# Patient Record
Sex: Female | Born: 1990 | Race: Black or African American | Hispanic: No | Marital: Married | Smoking: Never smoker
Health system: Southern US, Community
[De-identification: ages and names within clinical notes are randomized; demographics above are authoritative.]

## PROBLEM LIST (undated history)

## (undated) ENCOUNTER — Inpatient Hospital Stay (HOSPITAL_COMMUNITY): Payer: BC Managed Care – PPO

## (undated) DIAGNOSIS — E785 Hyperlipidemia, unspecified: Secondary | ICD-10-CM

## (undated) DIAGNOSIS — N912 Amenorrhea, unspecified: Secondary | ICD-10-CM

## (undated) DIAGNOSIS — O24419 Gestational diabetes mellitus in pregnancy, unspecified control: Secondary | ICD-10-CM

## (undated) DIAGNOSIS — D219 Benign neoplasm of connective and other soft tissue, unspecified: Secondary | ICD-10-CM

## (undated) DIAGNOSIS — R011 Cardiac murmur, unspecified: Secondary | ICD-10-CM

## (undated) DIAGNOSIS — D649 Anemia, unspecified: Secondary | ICD-10-CM

## (undated) HISTORY — DX: Amenorrhea, unspecified: N91.2

## (undated) HISTORY — DX: Cardiac murmur, unspecified: R01.1

## (undated) HISTORY — PX: NO PAST SURGERIES: SHX2092

## (undated) HISTORY — DX: Hyperlipidemia, unspecified: E78.5

## (undated) HISTORY — DX: Anemia, unspecified: D64.9

---

## 2012-06-23 ENCOUNTER — Ambulatory Visit
Admission: RE | Admit: 2012-06-23 | Discharge: 2012-06-23 | Disposition: A | Discharge status: Home / Self Care | Payer: BC Managed Care – PPO | Source: Ambulatory Visit | Attending: Family Medicine | Admitting: Family Medicine

## 2012-06-23 ENCOUNTER — Other Ambulatory Visit: Payer: Self-pay | Admitting: Family Medicine

## 2012-06-23 DIAGNOSIS — R102 Pelvic and perineal pain: Secondary | ICD-10-CM

## 2014-11-18 DIAGNOSIS — D649 Anemia, unspecified: Secondary | ICD-10-CM

## 2014-11-18 HISTORY — DX: Anemia, unspecified: D64.9

## 2015-11-19 DIAGNOSIS — E785 Hyperlipidemia, unspecified: Secondary | ICD-10-CM

## 2015-11-19 HISTORY — DX: Hyperlipidemia, unspecified: E78.5

## 2016-01-29 ENCOUNTER — Ambulatory Visit (INDEPENDENT_AMBULATORY_CARE_PROVIDER_SITE_OTHER): Payer: BLUE CROSS/BLUE SHIELD | Admitting: Obstetrics and Gynecology

## 2016-01-29 ENCOUNTER — Encounter: Payer: Self-pay | Admitting: Obstetrics and Gynecology

## 2016-01-29 VITALS — BP 122/78 | HR 86 | Resp 14 | Ht 66.0 in | Wt 262.8 lb

## 2016-01-29 DIAGNOSIS — N912 Amenorrhea, unspecified: Secondary | ICD-10-CM | POA: Diagnosis not present

## 2016-01-29 DIAGNOSIS — E669 Obesity, unspecified: Secondary | ICD-10-CM

## 2016-01-29 LAB — POCT URINE PREGNANCY: Preg Test, Ur: NEGATIVE

## 2016-01-29 MED ORDER — MEDROXYPROGESTERONE ACETATE 10 MG PO TABS: 10.0000 mg | ORAL_TABLET | Freq: Every day | ORAL | Status: DC

## 2016-01-29 NOTE — Patient Instructions (Signed)
Polycystic Ovarian Syndrome  Polycystic ovarian syndrome (PCOS) is a common hormonal disorder among women of reproductive age. Most women with PCOS grow many small cysts on their ovaries. PCOS can cause problems with your periods and make it difficult to get pregnant. It can also cause an increased risk of miscarriage with pregnancy. If left untreated, PCOS can lead to serious health problems, such as diabetes and heart disease.  CAUSES  The cause of PCOS is not fully understood, but genetics may be a factor.  SIGNS AND SYMPTOMS   · Infrequent or no menstrual periods.    · Inability to get pregnant (infertility) because of not ovulating.    · Increased growth of hair on the face, chest, stomach, back, thumbs, thighs, or toes.    · Acne, oily skin, or dandruff.    · Pelvic pain.    · Weight gain or obesity, usually carrying extra weight around the waist.    · Type 2 diabetes.     · High cholesterol.    · High blood pressure.    · Female-pattern baldness or thinning hair.    · Patches of thickened and dark brown or black skin on the neck, arms, breasts, or thighs.    · Tiny excess flaps of skin (skin tags) in the armpits or neck area.    · Excessive snoring and having breathing stop at times while asleep (sleep apnea).    · Deepening of the voice.    · Gestational diabetes when pregnant.    DIAGNOSIS   There is no single test to diagnose PCOS.   · Your health care provider will:      Take a medical history.      Perform a pelvic exam.      Have ultrasonography done.      Check your female and female hormone levels.      Measure glucose or sugar levels in the blood.      Do other blood tests.    · If you are producing too many female hormones, your health care provider will make sure it is from PCOS. At the physical exam, your health care provider will want to evaluate the areas of increased hair growth. Try to allow natural hair growth for a few days before the visit.    · During a pelvic exam, the ovaries may be enlarged  or swollen because of the increased number of small cysts. This can be seen more easily by using vaginal ultrasonography or screening to examine the ovaries and lining of the uterus (endometrium) for cysts. The uterine lining may become thicker if you have not been having a regular period.    TREATMENT   Because there is no cure for PCOS, it needs to be managed to prevent problems. Treatments are based on your symptoms. Treatment is also based on whether you want to have a baby or whether you need contraception.   Treatment may include:   · Progesterone hormone to start a menstrual period.    · Birth control pills to make you have regular menstrual periods.    · Medicines to make you ovulate, if you want to get pregnant.    · Medicines to control your insulin.    · Medicine to control your blood pressure.    · Medicine and diet to control your high cholesterol and triglycerides in your blood.  · Medicine to reduce excessive hair growth.   · Surgery, making small holes in the ovary, to decrease the amount of female hormone production. This is done through a long, lighted tube (laparoscope) placed into the pelvis through a tiny incision in the lower abdomen.      HOME CARE INSTRUCTIONS  · Only take over-the-counter or prescription medicine as directed by your health care provider.  · Pay attention to the foods you eat and your activity levels. This can help reduce the effects of PCOS.    Keep your weight under control.    Eat foods that are low in carbohydrate and high in fiber.    Exercise regularly.  SEEK MEDICAL CARE IF:  · Your symptoms do not get better with medicine.  · You have new symptoms.     This information is not intended to replace advice given to you by your health care provider. Make sure you discuss any questions you have with your health care provider.     Document Released: 02/28/2005 Document Revised: 08/25/2013 Document Reviewed: 04/22/2013  Elsevier Interactive Patient Education ©2016 Elsevier  Inc.

## 2016-01-29 NOTE — Progress Notes (Signed)
Patient ID: Rhonda Turner, female   DOB: 1991-05-01, 25 y.o.   MRN: [id number] GYNECOLOGY  VISIT   HPI: 25 y.o.   Single  African American  female   [place] with Patient's last menstrual period was 12/03/2015 (exact date).   here for evaluation of amenorrhea.  Patient states normal cycles until 09/2015.  Her cycles then became 40-50 days apart.  She does bleeding 7-14 days when she does have a cycle and has severe cramping.    Menarche in 7th grade.  Cycles have been regular until November 2016.  Cycle was late by about a week.  Same pattern of one week late in December and January.  Then no cycle since 12/03/15. Bleeding pattern is to use pad with tampon - change every 3 hours and some clotting.  Some cramping.  Ibuprofen decreases the cramping, but they do not go completely away.  Able to work sometimes with her cycles.  Can take off two days due to the pain.   Tried birth control in high school and had nausea and vomiting.  Ibuprofen did not work well.  Feels like she is cold always.  No constipation.  No hair loss.  Gained 40 pounds from June 2016 - until now.  Quit her job as this was very stressful and when she gained the weight.  Some headaches.  No loss of vision.  No nipple discharge.  No hair growth or loss.   Did UPTs at home which are negative.  Hx of ovarian cysts since high school. Been to the ER for pain due to ruptured ovarian cysts.  Last  U/S 2013.   Just graduated from [employer]. 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  UPT - Negative.   PCP - none.   GYNECOLOGIC HISTORY: Patient's last menstrual period was 12/03/2015 (exact date). Contraception:condoms everytime Menopausal hormone therapy: n/a Last mammogram: n/a Last pap smear: Never per patient        OB History    Gravida Para Term Preterm AB TAB SAB Ectopic Multiple Living   0 0 0 0 0 0 0 0 0 0          There are no active problems to display for this  patient.   Past Medical History  Diagnosis Date  . Heart murmur     --as a child-- no current workup  . Amenorrhea     began skipping cycles 09/2015  . Anemia 2016    History reviewed. No pertinent past surgical history.  No current outpatient prescriptions on file.   No current facility-administered medications for this visit.     ALLERGIES: Review of patient's allergies indicates no known allergies.  Family History  Problem Relation Age of Onset  . Diabetes Mother   . Hypertension Mother   . Stroke Maternal Grandmother   . Hypertension Maternal Grandfather     Social History   Social History  . Marital Status: Single    Spouse Name: N/A  . Number of Children: N/A  . Years of Education: N/A   Occupational History  . Not on file.   Social History Main Topics  . Smoking status: Never Smoker   . Smokeless tobacco: Not on file  . Alcohol Use: 1.2 oz/week    2 Standard drinks or equivalent per week  . Drug Use: No  . Sexual Activity:    Partners: Male    Birth Control/ Protection: Condom     Comment: condoms  everytime   Other Topics Concern  . Not on file   Social History Narrative  . No narrative on file    ROS:  Pertinent items are noted in HPI.  PHYSICAL EXAMINATION:    BP 122/78 mmHg  Pulse 86  Resp 14  Ht 5\' 6"  (1.676 m)  Wt 262 lb 12.8 oz (119.205 kg)  BMI 42.44 kg/m2  LMP 12/03/2015 (Exact Date)    General appearance: alert, cooperative and appears stated age Head: Normocephalic, without obvious abnormality, atraumatic Neck: no adenopathy, supple, symmetrical, trachea midline and thyroid normal to inspection and palpation Lungs: clear to auscultation bilaterally Breasts: normal appearance, no masses or tenderness, Inspection negative, No nipple retraction or dimpling, No nipple discharge or bleeding, No axillary or supraclavicular adenopathy Heart: regular rate and rhythm Abdomen: soft, non-tender; bowel sounds normal; no masses,  no  organomegaly Extremities: extremities normal, atraumatic, no cyanosis or edema Skin: Skin color, texture, turgor normal. No rashes or lesions Lymph nodes: Cervical, supraclavicular, and axillary nodes normal. No abnormal inguinal nodes palpated Neurologic: Grossly normal  Pelvic: External genitalia:  no lesions              Urethra:  normal appearing urethra with no masses, tenderness or lesions              Bartholins and Skenes: normal                 Vagina: normal appearing vagina with normal color and discharge, no lesions              Cervix: no lesions            Bimanual Exam:  Uterus:  normal size, contour, position, consistency, mobility, non-tender              Adnexa: normal adnexa and no mass, fullness, tenderness              Bimanual exam is limited by body habitus.             Chaperone was present for exam.  ASSESSMENT  Oligomenorrhea. UPT negative.  Menorrhagia.  Hx anemia.  Dysmenorrhea. Obesity. Hx of ruptured ovarian cysts.  PLAN  Counseled regarding irregular cycles and suspected Polycystic Ovarian Disease. Labs - TSH, prolactin, LH, FSH, estradiol, CMP, CBC, cholesterol, fasting insulin.  Provera 10 mg po q d for 10 days.  Instructed in use.  Return for pelvic ultrasound. Final plan for cycle regulation and treatment of dysmenorrhea to follow ultrasound and blood work. Discussed the importance of weight loss.   An After Visit Summary was printed and given to the patient.  ___45___ minutes face to face time of which over 50% was spent in counseling.

## 2016-01-30 ENCOUNTER — Telehealth: Payer: Self-pay | Admitting: Obstetrics and Gynecology

## 2016-01-30 NOTE — Telephone Encounter (Signed)
Called patient to review benefits for a recommended procedure. Left Voicemail requesting a call back. °

## 2016-01-31 NOTE — Telephone Encounter (Signed)
Patient returned my call. Reviewed benefit for ultrasound. Patient understood and agreeable. Patient ready to schedule. Patient scheduled 02/01/16 with Dr Quincy Simmonds. Pt aware of arrival date and time. Pt aware of 72 hours cancellation policy with [id number] fee. No further questions. Ok to close

## 2016-02-01 ENCOUNTER — Ambulatory Visit (INDEPENDENT_AMBULATORY_CARE_PROVIDER_SITE_OTHER): Payer: BLUE CROSS/BLUE SHIELD | Admitting: Obstetrics and Gynecology

## 2016-02-01 ENCOUNTER — Ambulatory Visit (INDEPENDENT_AMBULATORY_CARE_PROVIDER_SITE_OTHER): Payer: BLUE CROSS/BLUE SHIELD

## 2016-02-01 ENCOUNTER — Other Ambulatory Visit: Payer: Self-pay | Admitting: Obstetrics and Gynecology

## 2016-02-01 ENCOUNTER — Encounter: Payer: Self-pay | Admitting: Obstetrics and Gynecology

## 2016-02-01 VITALS — BP 110/70 | HR 80 | Ht 66.0 in | Wt 260.0 lb

## 2016-02-01 DIAGNOSIS — N912 Amenorrhea, unspecified: Secondary | ICD-10-CM | POA: Diagnosis not present

## 2016-02-01 DIAGNOSIS — N926 Irregular menstruation, unspecified: Secondary | ICD-10-CM

## 2016-02-01 LAB — CBC
HEMATOCRIT: 38.3 % (ref 36.0–46.0)
HEMOGLOBIN: 12.6 g/dL (ref 12.0–15.0)
MCH: 26.5 pg (ref 26.0–34.0)
MCHC: 32.9 g/dL (ref 30.0–36.0)
MCV: 80.6 fL (ref 78.0–100.0)
MPV: 10.3 fL (ref 8.6–12.4)
Platelets: 199 10*3/uL (ref 150–400)
RBC: 4.75 MIL/uL (ref 3.87–5.11)
RDW: 16.2 % — AB (ref 11.5–15.5)
WBC: 4.3 10*3/uL (ref 4.0–10.5)

## 2016-02-01 MED ORDER — NORETHINDRONE 0.35 MG PO TABS: 1.0000 | ORAL_TABLET | Freq: Every day | ORAL | Status: DC

## 2016-02-01 NOTE — Progress Notes (Signed)
Subjective  25 y.o. G0P0000 Single African American female here for pelvic ultrasound for evaluation of oligomenorrhea and hx of ovarian cysts.   Patient's last menstrual period was 12/03/2015 (exact date).   Patient has hx of recent rapid weight gain, and then irregularity of menses. Menses last 7 - 14 days when occur and can be very heavy. Doing lab work today to check for PCOS and other etiologies of irregular cycles.  Had nausea with use of combined. OCPs in the past.   Doing course of Provera now.  STD testing last May 2017.  All was negative.   Objective  Pelvic ultrasound images and report reviewed with patient.  Uterus - 15 mm anterior subserosal fibroid. EMS - 9.85 mm. Ovaries - right ovary with enlarged volume and peripheral follicles consistent with PCOS pattern.  Left ovary normal volume. Free fluid - no      Assessment  Irregular menses.  Menorrhagia. I suspect PCOS etiology. Nausea w/ combined OCPs  Plan  Discussion of PCOS.  Proceed with lab work as ordered - fasting - chol, TSH, FSH, LH, prolactin, estradiol, fasting insulin, CMP, CBC. Discussion of options for cycles regulation. Micronor, Depo Provera, Nexplanon, progesterone IUDs. Patient chooses Micronor.  Instructed in use.  Follow up in 2 months for a recheck and annual exam and pap.  ___15____ minutes face to face time of which over 50% was spent in counseling.   After visit summary to patient.

## 2016-02-02 LAB — COMPREHENSIVE METABOLIC PANEL
ALBUMIN: 4.5 g/dL (ref 3.6–5.1)
ALT: 12 U/L (ref 6–29)
AST: 14 U/L (ref 10–30)
Alkaline Phosphatase: 65 U/L (ref 33–115)
BILIRUBIN TOTAL: 0.4 mg/dL (ref 0.2–1.2)
BUN: 9 mg/dL (ref 7–25)
CALCIUM: 9.3 mg/dL (ref 8.6–10.2)
CHLORIDE: 102 mmol/L (ref 98–110)
CO2: 26 mmol/L (ref 20–31)
Creat: 0.85 mg/dL (ref 0.50–1.10)
GLUCOSE: 94 mg/dL (ref 65–99)
POTASSIUM: 3.8 mmol/L (ref 3.5–5.3)
Sodium: 136 mmol/L (ref 135–146)
Total Protein: 7.5 g/dL (ref 6.1–8.1)

## 2016-02-02 LAB — LIPID PANEL
CHOL/HDL RATIO: 4.8 ratio (ref <=5.0)
CHOLESTEROL: 267 mg/dL — AB (ref 125–200)
HDL: 56 mg/dL (ref >=46)
LDL Cholesterol: 193 mg/dL — ABNORMAL HIGH (ref <130)
TRIGLYCERIDES: 89 mg/dL (ref <150)
VLDL: 18 mg/dL (ref <30)

## 2016-02-02 LAB — FSH/LH
FSH: 7.9 m[IU]/mL
LH: 20.6 m[IU]/mL

## 2016-02-02 LAB — INSULIN, FASTING: Insulin fasting, serum: 14.4 u[IU]/mL (ref 2.0–19.6)

## 2016-02-02 LAB — ESTRADIOL: Estradiol: 98 pg/mL

## 2016-02-02 LAB — PROLACTIN: Prolactin: 6.1 ng/mL

## 2016-02-02 LAB — TSH: TSH: 1.25 mIU/L

## 2016-02-07 LAB — HEMOGLOBIN A1C
HEMOGLOBIN A1C: 5.6 % (ref <5.7)
MEAN PLASMA GLUCOSE: 114 mg/dL (ref <117)

## 2016-02-09 ENCOUNTER — Encounter: Payer: Self-pay | Admitting: Obstetrics and Gynecology

## 2016-02-12 ENCOUNTER — Telehealth: Payer: Self-pay | Admitting: Emergency Medicine

## 2016-02-12 NOTE — Telephone Encounter (Signed)
-----   Message from Nunzio Cobbs, MD sent at 02/09/2016  2:41 PM EDT ----- I just sent through a message about the patient's full labs.  The hemoglobin A1C was reported separately and does not show prediabetes or diabetes.   Cc- Marisa Sprinkles

## 2016-02-12 NOTE — Telephone Encounter (Signed)
Message left to return call to [place] at [phone].   Patient is scheduled for annual exam with Dr. Quincy Simmonds 04/24/16 at 0800.

## 2016-02-12 NOTE — Telephone Encounter (Signed)
Patient returned call and she is given results of lab work from Dr. Quincy Simmonds.  Verbalized understanding and will followup as scheduled for annual exam in June with Dr. Quincy Simmonds.

## 2016-02-12 NOTE — Telephone Encounter (Signed)
-----   Message from Nunzio Cobbs, MD sent at 02/09/2016  2:39 PM EDT ----- Please inform patient of her labs.  LH and FSH levels support a diagnosis of PCOS as we discussed.  Her cholesterol is elevated and can be reduced through a low cholesterol diet, a vigorous exercise program, and slow sustained weight loss.  Glucose and insulin levels are normal.  Thyroid, prolactin, blood chemistries and blood counts are normal.   I will see her in June for her annual exam and recheck of use of the Micronor.  Cc- Marisa Sprinkles

## 2016-04-24 ENCOUNTER — Ambulatory Visit: Payer: BLUE CROSS/BLUE SHIELD | Admitting: Obstetrics and Gynecology

## 2016-05-16 ENCOUNTER — Ambulatory Visit: Payer: BLUE CROSS/BLUE SHIELD | Admitting: Obstetrics and Gynecology

## 2016-05-16 ENCOUNTER — Encounter: Payer: Self-pay | Admitting: Obstetrics and Gynecology

## 2016-09-24 ENCOUNTER — Encounter: Payer: Self-pay | Admitting: Obstetrics and Gynecology

## 2019-11-19 NOTE — L&D Delivery Note (Signed)
Cesarean Section Procedure Note  Indications: cephalo-pelvic disproportion, fibroids, non-reassuring fetal status and LGA  Pre-operative Diagnosis: 38 week 3 day pregnancy.  Post-operative Diagnosis: same  Surgeon: Isaiah Serge DO  Assistants: Eula Flax MD  Melissa D.Y Scrub  Anesthesia: Spinal anesthesia  ASA Class: 3   Procedure Details    The patient was seen in the Holding Room. The risks, benefits, complications, treatment options, and expected outcomes were discussed with the patient.  The patient concurred with the proposed plan, giving informed consent.   The patient was taken to Operating Room, identified as Rhonda Turner and the procedure verified as C-Section Delivery. A Time Out was held and the above information confirmed.  After induction of anesthesia, a traxi was placed.  The patient begun to complain of shortness of breath and anxiety. Position changes were safely effected for pt reassurance as vitals were stable. At this time, fetal heart tones were noted to be decreased in the 80s thus stat cesarean section was called.   The pt was was draped and prepped with chlorahexidine and the drape placed. A quick time out was reverified. . A Pfannenstiel incision was made and carried down through the subcutaneous tissue to the fascia. Fascial incision was made and extended transversely. The fascia was separated from the underlying rectus tissue superiorly and inferiorly. The peritoneum was identified and entered. Peritoneal incision was extended longitudinally. An alexis retractor was placed. A low transverse uterine incision was made. Delivered from cephalic presentation was a 9lbs 7oz gram Female with Apgar scores of 3 at one minute and 7 at five minutes and 8 at 46mins. After the umbilical cord was clamped and cut cord gas and blood were obtained for evaluation. The placenta was removed intact and appeared normal. The uterine was noted to have a lage palm sized (  6cm) anterior fibroid in the lower left uterine quadrant, just above the incision and a large fundal right fibroid alone with a few smaller subserosal ones. The tubes and ovaries appeared normal. The uterine incision was closed with running locked sutures of O Vicryl. Hemostasis was observed. Lavage was carried out until clear. The peritoneum was closed with 3-0 vicryl. The fascia was then reapproximated with running sutures of O Vicryl. The skin was reapproximated with 4-0 vicryl on a keith .  Instrument, sponge, and needle counts were correct prior the abdominal closure and at the conclusion of the case.   Findings: Viable female infant, Apgars 3,7,8; cord gas 7.02,  Multilobed ibroid uterus with normal appearing tubes and ovaries  Estimated Blood Loss:  831ml         Drains: foley to gavity 138ml         Total IV Fluids:  3073ml         Specimens: Placenta and Disposition:  Sent to Pathology          Implants: n/a         Complications:  None; patient tolerated the procedure well.         Disposition: PACU - hemodynamically stable.         Condition: stable  Attending Attestation: I performed the procedure.

## 2019-12-28 LAB — OB RESULTS CONSOLE ABO/RH: RH Type: POSITIVE

## 2019-12-28 LAB — OB RESULTS CONSOLE ANTIBODY SCREEN: Antibody Screen: NEGATIVE

## 2019-12-28 LAB — OB RESULTS CONSOLE HIV ANTIBODY (ROUTINE TESTING): HIV: NONREACTIVE

## 2019-12-28 LAB — OB RESULTS CONSOLE GC/CHLAMYDIA
Chlamydia: NEGATIVE
Gonorrhea: NEGATIVE

## 2019-12-28 LAB — OB RESULTS CONSOLE HEPATITIS B SURFACE ANTIGEN: Hepatitis B Surface Ag: NEGATIVE

## 2019-12-28 LAB — OB RESULTS CONSOLE RPR: RPR: NONREACTIVE

## 2019-12-28 LAB — OB RESULTS CONSOLE RUBELLA ANTIBODY, IGM: Rubella: IMMUNE

## 2020-01-18 ENCOUNTER — Other Ambulatory Visit: Payer: Self-pay

## 2020-01-18 ENCOUNTER — Encounter (HOSPITAL_COMMUNITY): Payer: Self-pay | Admitting: Family Medicine

## 2020-01-18 ENCOUNTER — Emergency Department (HOSPITAL_COMMUNITY)
Admission: EM | Admit: 2020-01-18 | Discharge: 2020-01-18 | Disposition: A | Discharge status: Home / Self Care | Payer: BC Managed Care – PPO | Attending: Emergency Medicine | Admitting: Emergency Medicine

## 2020-01-18 DIAGNOSIS — O99891 Other specified diseases and conditions complicating pregnancy: Secondary | ICD-10-CM | POA: Insufficient documentation

## 2020-01-18 DIAGNOSIS — R8271 Bacteriuria: Secondary | ICD-10-CM

## 2020-01-18 DIAGNOSIS — O219 Vomiting of pregnancy, unspecified: Secondary | ICD-10-CM | POA: Insufficient documentation

## 2020-01-18 DIAGNOSIS — Z79899 Other long term (current) drug therapy: Secondary | ICD-10-CM | POA: Insufficient documentation

## 2020-01-18 DIAGNOSIS — Z3A22 22 weeks gestation of pregnancy: Secondary | ICD-10-CM | POA: Diagnosis not present

## 2020-01-18 LAB — CBC WITH DIFFERENTIAL/PLATELET
Abs Immature Granulocytes: 0.03 10*3/uL (ref 0.00–0.07)
Basophils Absolute: 0 10*3/uL (ref 0.0–0.1)
Basophils Relative: 1 %
Eosinophils Absolute: 0.1 10*3/uL (ref 0.0–0.5)
Eosinophils Relative: 2 %
HCT: 39.2 % (ref 36.0–46.0)
Hemoglobin: 11.8 g/dL — ABNORMAL LOW (ref 12.0–15.0)
Immature Granulocytes: 1 %
Lymphocytes Relative: 24 %
Lymphs Abs: 1.6 10*3/uL (ref 0.7–4.0)
MCH: 24.6 pg — ABNORMAL LOW (ref 26.0–34.0)
MCHC: 30.1 g/dL (ref 30.0–36.0)
MCV: 81.7 fL (ref 80.0–100.0)
Monocytes Absolute: 0.8 10*3/uL (ref 0.1–1.0)
Monocytes Relative: 12 %
Neutro Abs: 4.1 10*3/uL (ref 1.7–7.7)
Neutrophils Relative %: 60 %
Platelets: 262 10*3/uL (ref 150–400)
RBC: 4.8 MIL/uL (ref 3.87–5.11)
RDW: 18.6 % — ABNORMAL HIGH (ref 11.5–15.5)
WBC: 6.6 10*3/uL (ref 4.0–10.5)
nRBC: 0 % (ref 0.0–0.2)

## 2020-01-18 LAB — URINALYSIS, ROUTINE W REFLEX MICROSCOPIC
Bilirubin Urine: NEGATIVE
Glucose, UA: NEGATIVE mg/dL
Hgb urine dipstick: NEGATIVE
Ketones, ur: NEGATIVE mg/dL
Nitrite: NEGATIVE
Protein, ur: 30 mg/dL — AB
Specific Gravity, Urine: 1.027 (ref 1.005–1.030)
pH: 9 — ABNORMAL HIGH (ref 5.0–8.0)

## 2020-01-18 LAB — COMPREHENSIVE METABOLIC PANEL
ALT: 12 U/L (ref 0–44)
AST: 17 U/L (ref 15–41)
Albumin: 4 g/dL (ref 3.5–5.0)
Alkaline Phosphatase: 49 U/L (ref 38–126)
Anion gap: 10 (ref 5–15)
BUN: 6 mg/dL (ref 6–20)
CO2: 24 mmol/L (ref 22–32)
Calcium: 9.2 mg/dL (ref 8.9–10.3)
Chloride: 102 mmol/L (ref 98–111)
Creatinine, Ser: 0.71 mg/dL (ref 0.44–1.00)
GFR calc Af Amer: >60 mL/min (ref >60)
GFR calc non Af Amer: >60 mL/min (ref >60)
Glucose, Bld: 86 mg/dL (ref 70–99)
Potassium: 3.5 mmol/L (ref 3.5–5.1)
Sodium: 136 mmol/L (ref 135–145)
Total Bilirubin: 0.4 mg/dL (ref 0.3–1.2)
Total Protein: 8.4 g/dL — ABNORMAL HIGH (ref 6.5–8.1)

## 2020-01-18 LAB — PREGNANCY, URINE: Preg Test, Ur: POSITIVE — AB

## 2020-01-18 MED ORDER — PYRIDOXINE HCL 100 MG/ML IJ SOLN
100.0000 mg | Freq: Once | INTRAMUSCULAR | Status: AC
  Administered 2020-01-18: 100 mg via INTRAVENOUS
  Filled 2020-01-18: qty 1

## 2020-01-18 MED ORDER — NITROFURANTOIN MONOHYD MACRO 100 MG PO CAPS: 100.0000 mg | ORAL_CAPSULE | Freq: Two times a day (BID) | ORAL | 0 refills | Status: DC

## 2020-01-18 MED ORDER — PROMETHAZINE HCL 25 MG/ML IJ SOLN
25.0000 mg | Freq: Once | INTRAMUSCULAR | Status: AC
  Administered 2020-01-18: 25 mg via INTRAVENOUS
  Filled 2020-01-18: qty 1

## 2020-01-18 MED ORDER — DEXTROSE 5 % AND 0.45 % NACL IV BOLUS
1000.0000 mL | Freq: Once | INTRAVENOUS | Status: AC
  Administered 2020-01-18: 1000 mL via INTRAVENOUS

## 2020-01-18 NOTE — ED Notes (Signed)
Patient attempted to drink apple juice, was only able to drink one sip, still feels nauseated.

## 2020-01-18 NOTE — ED Triage Notes (Addendum)
Patient reports she is [redacted] weeks pregnant and having vomiting. Denies abd pain.

## 2020-01-18 NOTE — ED Provider Notes (Signed)
[facility] DEPT Provider Note   CSN: [id number] Arrival date & time: 01/18/20  1638     History Chief Complaint  Patient presents with  . Emesis    [redacted] weeks pregnant     Rhonda Turner is a 29 y.o. female.  HPI     29 year old comes in a chief complaint of vomiting.  She is G1, P0 about [redacted] weeks pregnant.  Patient reports that for the last 2 days she has been having worsening of her nausea and vomiting.  She had emesis x3 yesterday and the same today.  She has constant nausea and anytime she eats or drink, she will just vomit.  She spoke with her OB who has prescribed medications for nausea for her, but advised her to come to the ER.  Review of system is positive for dizziness.  Patient denies any UTI-like symptoms, vaginal discharge, bleeding, abdominal pain, back pain.  Past Medical History:  Diagnosis Date  . Amenorrhea    began skipping cycles 09/2015  . Anemia 2016  . Heart murmur    --as a child-- no current workup  . Hyperlipidemia 2017    There are no problems to display for this patient.   History reviewed. No pertinent surgical history.   OB History    Gravida  1   Para  0   Term  0   Preterm  0   AB  0   Living  0     SAB  0   TAB  0   Ectopic  0   Multiple  0   Live Births              Family History  Problem Relation Age of Onset  . Diabetes Mother   . Hypertension Mother   . Other Mother        hx abnormal pap age 27--no Hyst  . Stroke Maternal Grandmother   . Hypertension Maternal Grandfather     Social History   Tobacco Use  . Smoking status: Never Smoker  . Smokeless tobacco: Never Used  Substance Use Topics  . Alcohol use: Not Currently    Alcohol/week: 2.0 standard drinks    Types: 2 Standard drinks or equivalent per week  . Drug use: No    Home Medications Prior to Admission medications   Medication Sig Start Date End Date Taking? Authorizing Provider  Prenatal Vit-Fe  Fumarate-FA (PRENATAL MULTIVITAMIN) TABS tablet Take 1 tablet by mouth daily at 12 noon.   Yes [provider]  medroxyPROGESTERone (PROVERA) 10 MG tablet Take 1 tablet (10 mg total) by mouth daily. Patient not taking: Reported on 01/18/2020 01/29/16   Nunzio Cobbs, MD  metoCLOPramide (REGLAN) 10 MG tablet Take 10 mg by mouth 3 (three) times daily as needed for nausea. 01/18/20   [provider]  nitrofurantoin, macrocrystal-monohydrate, (MACROBID) 100 MG capsule Take 1 capsule (100 mg total) by mouth 2 (two) times daily. 01/18/20   Varney Biles, MD  norethindrone (MICRONOR,CAMILA,ERRIN) 0.35 MG tablet Take 1 tablet (0.35 mg total) by mouth daily. Patient not taking: Reported on 01/18/2020 02/01/16   Nunzio Cobbs, MD    Allergies    Shellfish allergy  Review of Systems   Review of Systems  Constitutional: Positive for activity change.  Gastrointestinal: Positive for nausea and vomiting. Negative for abdominal pain.  Genitourinary: Negative for dysuria, vaginal bleeding and vaginal discharge.  Allergic/Immunologic: Negative for immunocompromised state.  All other  systems reviewed and are negative.   Physical Exam Updated Vital Signs BP 120/66   Pulse 71   Temp 97.9 F (36.6 C) (Oral)   Resp 18   Ht 5\' 5"  (1.651 m)   Wt 122.5 kg   SpO2 100%   BMI 44.93 kg/m   Physical Exam Vitals and nursing note reviewed.  Constitutional:      Appearance: She is well-developed.  HENT:     Head: Normocephalic and atraumatic.     Mouth/Throat:     Comments: Dry mucous membranes Eyes:     Extraocular Movements: Extraocular movements intact.     Pupils: Pupils are equal, round, and reactive to light.  Cardiovascular:     Rate and Rhythm: Normal rate.  Pulmonary:     Effort: Pulmonary effort is normal.  Abdominal:     General: Bowel sounds are normal.     Tenderness: There is no abdominal tenderness.  Musculoskeletal:     Cervical back: Normal range  of motion and neck supple.  Skin:    General: Skin is warm and dry.  Neurological:     Mental Status: She is alert and oriented to person, place, and time.     ED Results / Procedures / Treatments   Labs (all labs ordered are listed, but only abnormal results are displayed) Labs Reviewed  URINALYSIS, ROUTINE W REFLEX MICROSCOPIC - Abnormal; Notable for the following components:      Result Value   APPearance HAZY (*)    pH 9.0 (*)    Protein, ur 30 (*)    Leukocytes,Ua SMALL (*)    Bacteria, UA MANY (*)    All other components within normal limits  CBC WITH DIFFERENTIAL/PLATELET - Abnormal; Notable for the following components:   Hemoglobin 11.8 (*)    MCH 24.6 (*)    RDW 18.6 (*)    All other components within normal limits  COMPREHENSIVE METABOLIC PANEL - Abnormal; Notable for the following components:   Total Protein 8.4 (*)    All other components within normal limits  PREGNANCY, URINE - Abnormal; Notable for the following components:   Preg Test, Ur POSITIVE (*)    All other components within normal limits  URINE CULTURE    EKG None  Radiology No results found.  Procedures Ultrasound ED OB Pelvic  Date/Time: 01/18/2020 9:31 PM Performed by: Varney Biles, MD Authorized by: Varney Biles, MD   Procedure details:    Indications: pregnant with no fetal heart sounds     Assess:  Intrauterine pregnancy   Technique:  Transabdominal obstetric (HCG+) exam   Images: archived   Study Limitations: body habitus Uterine findings:    Single gestation: identified     Gestational sac: identified      Comments:     Fetal heart tones at 146 per ultrasound   (including critical care time)  Medications Ordered in ED Medications  dextrose 5 % and 0.45% NaCl 5-0.45 % bolus 1,000 mL (0 mLs Intravenous Stopped 01/18/20 1835)  pyridOXINE (B-6) injection 100 mg (100 mg Intravenous Given 01/18/20 1752)  promethazine (PHENERGAN) injection 25 mg (25 mg Intravenous Given 01/18/20  1900)    ED Course  I have reviewed the triage vital signs and the nursing notes.  Pertinent labs & imaging results that were available during my care of the patient were reviewed by me and considered in my medical decision making (see chart for details).  Clinical Course as of Jan 17 2129  Tue Jan 18, 2020  2129 Patient has asymptomatic bacteriuria.  I noticed the positive bacteria in the urine after the time of discharge.  I went in to talk to the patient and informed her that we will be prescribing her Macrobid.  She can consult with her OB if she would desire, however asymptomatic bacteriuria is treated in pregnancy to prevent complications in future.  Patient aware and has requested that the Macrobid be sent to pharmacy on [address]   Bacteria, UA(!): MANY [AN]    Clinical Course User Index [AN] Varney Biles, MD   MDM Rules/Calculators/A&P                      29 year old G33, P0 female comes in with chief complaint of nausea and vomiting.  She is currently pregnant.  She is noted to be dry.  Patient is not symptomatic with it.  She has no infection-like symptoms and she does not have any abdominal pain/back pain or vaginal bleeding.  Initial thought was this patient could be having hyperemesis gravidarum. Lab assessment however does not reveal that.  She was given a bolus of D5 half-normal saline.  Patient also was given promethazine IV after we discussed the risk versus benefit of receiving IV medication for nausea in the first trimester.  She was given vitamin B6 prior to it and did not have complete resolution of nausea.  After the promethazine patient did pass the oral challenge. Bedside ultrasound is reassuring. OB has already prescribed meds.  Final Clinical Impression(s) / ED Diagnoses Final diagnoses:  Nausea and vomiting during pregnancy prior to [redacted] weeks gestation  Asymptomatic bacteriuria during pregnancy    Rx / DC Orders ED Discharge Orders         Ordered     nitrofurantoin, macrocrystal-monohydrate, (MACROBID) 100 MG capsule  2 times daily     01/18/20 2128           Varney Biles, MD 01/18/20 2133

## 2020-01-18 NOTE — Discharge Instructions (Addendum)
We saw in the ER for nausea vomiting. Fortunately, your labs are reassuring and fetal heart tones are within normal limits.  Please pick up the prescription that has been placed by your [facility] doctor. Hydrate well. Return to the ER if your symptoms get worse.  Also we suspect that you might be having GERD -you might want to ask your OB about it and see if they have any medications they recommend.

## 2020-01-19 LAB — URINE CULTURE

## 2020-05-24 ENCOUNTER — Encounter: Payer: BC Managed Care – PPO | Attending: Obstetrics and Gynecology | Admitting: Registered"

## 2020-05-24 ENCOUNTER — Other Ambulatory Visit: Payer: Self-pay

## 2020-05-24 DIAGNOSIS — O99810 Abnormal glucose complicating pregnancy: Secondary | ICD-10-CM

## 2020-05-25 ENCOUNTER — Encounter: Payer: Self-pay | Admitting: Registered"

## 2020-05-25 DIAGNOSIS — O99810 Abnormal glucose complicating pregnancy: Secondary | ICD-10-CM | POA: Insufficient documentation

## 2020-05-25 NOTE — Progress Notes (Signed)

## 2020-07-04 ENCOUNTER — Other Ambulatory Visit (HOSPITAL_COMMUNITY): Payer: Self-pay | Admitting: *Deleted

## 2020-07-04 NOTE — Discharge Instructions (Signed)

## 2020-07-05 ENCOUNTER — Other Ambulatory Visit: Payer: Self-pay

## 2020-07-05 ENCOUNTER — Encounter (HOSPITAL_COMMUNITY)
Admission: RE | Admit: 2020-07-05 | Discharge: 2020-07-05 | Disposition: A | Discharge status: Home / Self Care | Payer: BC Managed Care – PPO | Source: Ambulatory Visit | Attending: Obstetrics and Gynecology | Admitting: Obstetrics and Gynecology

## 2020-07-05 DIAGNOSIS — D649 Anemia, unspecified: Secondary | ICD-10-CM | POA: Insufficient documentation

## 2020-07-05 DIAGNOSIS — O99019 Anemia complicating pregnancy, unspecified trimester: Secondary | ICD-10-CM | POA: Insufficient documentation

## 2020-07-05 DIAGNOSIS — Z3A00 Weeks of gestation of pregnancy not specified: Secondary | ICD-10-CM | POA: Insufficient documentation

## 2020-07-05 MED ORDER — SODIUM CHLORIDE 0.9 % IV SOLN
510.0000 mg | INTRAVENOUS | Status: DC
  Administered 2020-07-05: 510 mg via INTRAVENOUS
  Filled 2020-07-05: qty 17

## 2020-07-11 ENCOUNTER — Encounter (HOSPITAL_COMMUNITY)
Admission: RE | Admit: 2020-07-11 | Discharge: 2020-07-11 | Disposition: A | Discharge status: Home / Self Care | Payer: BC Managed Care – PPO | Source: Ambulatory Visit | Attending: Obstetrics and Gynecology | Admitting: Obstetrics and Gynecology

## 2020-07-11 ENCOUNTER — Other Ambulatory Visit: Payer: Self-pay

## 2020-07-11 DIAGNOSIS — O99019 Anemia complicating pregnancy, unspecified trimester: Secondary | ICD-10-CM | POA: Insufficient documentation

## 2020-07-11 DIAGNOSIS — D649 Anemia, unspecified: Secondary | ICD-10-CM | POA: Diagnosis not present

## 2020-07-11 DIAGNOSIS — Z3A00 Weeks of gestation of pregnancy not specified: Secondary | ICD-10-CM | POA: Diagnosis not present

## 2020-07-11 LAB — OB RESULTS CONSOLE GBS: GBS: POSITIVE

## 2020-07-11 MED ORDER — SODIUM CHLORIDE 0.9 % IV SOLN
510.0000 mg | INTRAVENOUS | Status: AC
  Administered 2020-07-11: 510 mg via INTRAVENOUS
  Filled 2020-07-11: qty 17

## 2020-07-12 ENCOUNTER — Encounter (HOSPITAL_COMMUNITY): Payer: BC Managed Care – PPO

## 2020-07-21 ENCOUNTER — Encounter (HOSPITAL_COMMUNITY): Payer: Self-pay

## 2020-07-21 ENCOUNTER — Other Ambulatory Visit: Payer: Self-pay

## 2020-07-21 ENCOUNTER — Encounter (HOSPITAL_COMMUNITY): Payer: Self-pay | Admitting: Obstetrics and Gynecology

## 2020-07-21 ENCOUNTER — Inpatient Hospital Stay (HOSPITAL_COMMUNITY)
Admission: AD | Admit: 2020-07-21 | Discharge: 2020-07-21 | Disposition: A | Discharge status: Home / Self Care | Payer: BC Managed Care – PPO | Source: Ambulatory Visit | Attending: Obstetrics and Gynecology | Admitting: Obstetrics and Gynecology

## 2020-07-21 DIAGNOSIS — Z8249 Family history of ischemic heart disease and other diseases of the circulatory system: Secondary | ICD-10-CM | POA: Insufficient documentation

## 2020-07-21 DIAGNOSIS — Z3A37 37 weeks gestation of pregnancy: Secondary | ICD-10-CM | POA: Insufficient documentation

## 2020-07-21 DIAGNOSIS — E785 Hyperlipidemia, unspecified: Secondary | ICD-10-CM | POA: Insufficient documentation

## 2020-07-21 DIAGNOSIS — O26893 Other specified pregnancy related conditions, third trimester: Secondary | ICD-10-CM | POA: Insufficient documentation

## 2020-07-21 DIAGNOSIS — O99283 Endocrine, nutritional and metabolic diseases complicating pregnancy, third trimester: Secondary | ICD-10-CM | POA: Diagnosis not present

## 2020-07-21 DIAGNOSIS — R030 Elevated blood-pressure reading, without diagnosis of hypertension: Secondary | ICD-10-CM

## 2020-07-21 LAB — URINALYSIS, ROUTINE W REFLEX MICROSCOPIC
Bilirubin Urine: NEGATIVE
Glucose, UA: NEGATIVE mg/dL
Hgb urine dipstick: NEGATIVE
Ketones, ur: NEGATIVE mg/dL
Nitrite: NEGATIVE
Protein, ur: 30 mg/dL — AB
Specific Gravity, Urine: 1.027 (ref 1.005–1.030)
pH: 6 (ref 5.0–8.0)

## 2020-07-21 LAB — CBC
HCT: 38.6 % (ref 36.0–46.0)
Hemoglobin: 11.2 g/dL — ABNORMAL LOW (ref 12.0–15.0)
MCH: 24.9 pg — ABNORMAL LOW (ref 26.0–34.0)
MCHC: 29 g/dL — ABNORMAL LOW (ref 30.0–36.0)
MCV: 85.8 fL (ref 80.0–100.0)
Platelets: 184 10*3/uL (ref 150–400)
RBC: 4.5 MIL/uL (ref 3.87–5.11)
RDW: 29.5 % — ABNORMAL HIGH (ref 11.5–15.5)
WBC: 5.1 10*3/uL (ref 4.0–10.5)
nRBC: 0.8 % — ABNORMAL HIGH (ref 0.0–0.2)

## 2020-07-21 LAB — COMPREHENSIVE METABOLIC PANEL
ALT: 15 U/L (ref 0–44)
AST: 14 U/L — ABNORMAL LOW (ref 15–41)
Albumin: 2.9 g/dL — ABNORMAL LOW (ref 3.5–5.0)
Alkaline Phosphatase: 104 U/L (ref 38–126)
Anion gap: 10 (ref 5–15)
BUN: 6 mg/dL (ref 6–20)
CO2: 20 mmol/L — ABNORMAL LOW (ref 22–32)
Calcium: 9.2 mg/dL (ref 8.9–10.3)
Chloride: 105 mmol/L (ref 98–111)
Creatinine, Ser: 0.71 mg/dL (ref 0.44–1.00)
GFR calc Af Amer: >60 mL/min (ref >60)
GFR calc non Af Amer: >60 mL/min (ref >60)
Glucose, Bld: 74 mg/dL (ref 70–99)
Potassium: 3.8 mmol/L (ref 3.5–5.1)
Sodium: 135 mmol/L (ref 135–145)
Total Bilirubin: 0.3 mg/dL (ref 0.3–1.2)
Total Protein: 6.5 g/dL (ref 6.5–8.1)

## 2020-07-21 LAB — PROTEIN / CREATININE RATIO, URINE
Creatinine, Urine: 265.12 mg/dL
Protein Creatinine Ratio: 0.12 mg/mg{Cre} (ref 0.00–0.15)
Total Protein, Urine: 32 mg/dL

## 2020-07-21 NOTE — Patient Instructions (Signed)
Rhonda Turner  07/21/2020   Your procedure is scheduled on:  07/27/2020  Arrive at 2:45 PM at Entrance C on [address] at [facility]  and [employer] [employer] [address]. You are invited to use the FREE valet parking or use the Visitor's parking deck.  Pick up the phone at the desk and dial [phone].  Call this number if you have problems the morning of surgery: [phone]  Remember:   Do not eat food:(After Midnight) Desps de medianoche.  Do not drink clear liquids: (6 Hours before arrival) 6 horas ante llegada.  Take these medicines the morning of surgery with A SIP OF WATER:  none   Do not wear jewelry, make-up or nail polish.  Do not wear lotions, powders, or perfumes. Do not wear deodorant.  Do not shave 48 hours prior to surgery.  Do not bring valuables to the hospital.  [facility] is not   responsible for any belongings or valuables brought to the hospital.  Contacts, dentures or bridgework may not be worn into surgery.  Leave suitcase in the car. After surgery it may be brought to your room.  For patients admitted to the hospital, checkout time is 11:00 AM the day of              discharge.      Please read over the following fact sheets that you were given:     Preparing for Surgery

## 2020-07-21 NOTE — MAU Note (Signed)
Sent from MD office for BP evaluation.  Currently reports H/A, no epigastric pain or visual disturbances.  Reports +FM.  Denies VB or LOF.

## 2020-07-21 NOTE — MAU Provider Note (Signed)
Patient Rhonda Turner is a 29 y.o. G1P0000  at [redacted]w[redacted]d here after being told to come in for BP evaluation. She denies blurry vision, floating spots, decreased fetal movement, vaginal bleeding, LOF. She has no history of blood pressure issues in this pregnancy; she is a GDMA1.   She cannot remember what her BP was at Dr. Ivor Costa office. She has a scheduled C/section scheduled for next week due to fibroids.   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 She is asymptomatic; she has not been advised by her school to quarantine.  History     CSN: [id number]  Arrival date and time: 07/21/20 1220   None     Chief Complaint  Patient presents with  . BP Evaluation   Headache  This is a new problem. The current episode started today. The problem occurs constantly. The pain is located in the bilateral and frontal region. The pain quality is not similar to prior headaches. The quality of the pain is described as aching. The pain is at a severity of 5/10. Pertinent negatives include no blurred vision, coughing, fever, nausea or vomiting. The symptoms are aggravated by bright light. Treatments tried: she did not try anything for her headache.   She has a history of migraines; her last migraine was several years ago.  OB History    Gravida  1   Para  0   Term  0   Preterm  0   AB  0   Living  0     SAB  0   TAB  0   Ectopic  0   Multiple  0   Live Births              Past Medical History:  Diagnosis Date  . Amenorrhea    began skipping cycles 09/2015  . Anemia 2016  . Fibroid   . Gestational diabetes   . Heart murmur    --as a child-- no current workup  . Hyperlipidemia 2017    Past Surgical History:  Procedure Laterality Date  . NO PAST SURGERIES      Family History  Problem Relation Age of Onset  . Diabetes Mother   . Hypertension Mother   . Other Mother        hx abnormal pap age 60--no Hyst  . Stroke Maternal  Grandmother   . Hypertension Maternal Grandfather     Social History   Tobacco Use  . Smoking status: Never Smoker  . Smokeless tobacco: Never Used  Substance Use Topics  . Alcohol use: Not Currently    Alcohol/week: 2.0 standard drinks    Types: 2 Standard drinks or equivalent per week  . Drug use: No    Allergies:  Allergies  Allergen Reactions  . Shellfish Allergy Hives    Medications Prior to Admission  Medication Sig Dispense Refill Last Dose  . Prenatal Vit-Fe Fumarate-FA (PRENATAL MULTIVITAMIN) TABS tablet Take 1 tablet by mouth every evening.    07/21/2020 at Unknown time    Review of Systems  Constitutional: Negative for fever.  Eyes: Negative for blurred vision.  Respiratory: Negative for cough.   Gastrointestinal: Negative for nausea and vomiting.  Genitourinary: Negative.   Musculoskeletal: Negative.   Neurological: Positive for headaches.  Psychiatric/Behavioral: Negative.    Physical Exam   Blood pressure 123/71, pulse 85, temperature 98.7 F (37.1 C), temperature source Oral, resp. rate 20, height 5\' 5"  (1.651 m), weight 132.9 kg, SpO2 99 %.  Physical Exam HENT:     Head: Normocephalic.  Cardiovascular:     Pulses: Normal pulses.  Musculoskeletal:        General: Normal range of motion.  Skin:    General: Skin is warm.  Neurological:     Mental Status: She is alert.     MAU Course  Procedures  MDM Patient Vitals for the past 24 hrs:  BP Temp Temp src Pulse Resp SpO2 Height Weight  07/21/20 1516 123/71 -- -- 85 -- -- -- --  07/21/20 1500 125/81 -- -- 89 -- -- -- --  07/21/20 1445 116/71 -- -- 80 -- -- -- --  07/21/20 1430 126/70 -- -- 85 -- -- -- --  07/21/20 1416 116/72 -- -- 88 -- -- -- --  07/21/20 1400 133/70 -- -- 95 -- -- -- --  07/21/20 1345 122/87 -- -- (!) 104 -- -- -- --  07/21/20 1330 135/79 -- -- 84 -- -- -- --  07/21/20 1315 137/81 -- -- 96 -- -- -- --  07/21/20 1254 121/77 98.7 F (37.1 C) Oral 91 20 99 % -- --   07/21/20 1250 -- -- -- -- -- -- 5\' 5"  (1.651 m) 132.9 kg   -will draw pre-e labs -CBC: normal -CMP: normal -PCR: normal -NST: 135 bpm, mod var, present acel, no decels, no contractions -she refuses Tylenol for her headache; she feels like she just wants to calm down.  1531: reports that HA now a 3/10  Assessment and Plan   1. Transient hypertension    2. Patient stable for discharge; pre-e labs are negative and patient has had no elevated BPs while in MAU. Patient will take BP at home; has follow up appt on Wednesday and scheduled C/section on Thursday.   3.Dr. Marvel Plan updated on patient's presentation, BPs, lab work.   Mervyn Skeeters [name] 07/21/2020, 3:55 PM

## 2020-07-25 ENCOUNTER — Encounter (HOSPITAL_COMMUNITY): Payer: Self-pay

## 2020-07-25 ENCOUNTER — Other Ambulatory Visit: Payer: Self-pay

## 2020-07-25 ENCOUNTER — Other Ambulatory Visit (HOSPITAL_COMMUNITY)
Admission: RE | Admit: 2020-07-25 | Discharge: 2020-07-25 | Disposition: A | Discharge status: Home / Self Care | Payer: BC Managed Care – PPO | Source: Ambulatory Visit | Attending: Obstetrics & Gynecology | Admitting: Obstetrics & Gynecology

## 2020-07-25 DIAGNOSIS — Z20822 Contact with and (suspected) exposure to covid-19: Secondary | ICD-10-CM | POA: Insufficient documentation

## 2020-07-25 HISTORY — DX: Benign neoplasm of connective and other soft tissue, unspecified: D21.9

## 2020-07-25 HISTORY — DX: Gestational diabetes mellitus in pregnancy, unspecified control: O24.419

## 2020-07-25 LAB — COMPREHENSIVE METABOLIC PANEL
ALT: 12 U/L (ref 0–44)
AST: 27 U/L (ref 15–41)
Albumin: 2.8 g/dL — ABNORMAL LOW (ref 3.5–5.0)
Alkaline Phosphatase: 107 U/L (ref 38–126)
Anion gap: 9 (ref 5–15)
BUN: 7 mg/dL (ref 6–20)
CO2: 20 mmol/L — ABNORMAL LOW (ref 22–32)
Calcium: 9.1 mg/dL (ref 8.9–10.3)
Chloride: 107 mmol/L (ref 98–111)
Creatinine, Ser: 0.74 mg/dL (ref 0.44–1.00)
GFR calc Af Amer: >60 mL/min (ref >60)
GFR calc non Af Amer: >60 mL/min (ref >60)
Glucose, Bld: 129 mg/dL — ABNORMAL HIGH (ref 70–99)
Potassium: 4.1 mmol/L (ref 3.5–5.1)
Sodium: 136 mmol/L (ref 135–145)
Total Bilirubin: 0.7 mg/dL (ref 0.3–1.2)
Total Protein: 6.2 g/dL — ABNORMAL LOW (ref 6.5–8.1)

## 2020-07-25 LAB — CBC
HCT: 39.2 % (ref 36.0–46.0)
Hemoglobin: 11.4 g/dL — ABNORMAL LOW (ref 12.0–15.0)
MCH: 25.3 pg — ABNORMAL LOW (ref 26.0–34.0)
MCHC: 29.1 g/dL — ABNORMAL LOW (ref 30.0–36.0)
MCV: 86.9 fL (ref 80.0–100.0)
Platelets: 208 10*3/uL (ref 150–400)
RBC: 4.51 MIL/uL (ref 3.87–5.11)
RDW: 29.4 % — ABNORMAL HIGH (ref 11.5–15.5)
WBC: 4.4 10*3/uL (ref 4.0–10.5)
nRBC: 0.5 % — ABNORMAL HIGH (ref 0.0–0.2)

## 2020-07-25 LAB — TYPE AND SCREEN
ABO/RH(D): O POS
Antibody Screen: NEGATIVE

## 2020-07-25 LAB — SARS CORONAVIRUS 2 (TAT 6-24 HRS): SARS Coronavirus 2: NEGATIVE

## 2020-07-25 LAB — RPR: RPR Ser Ql: NONREACTIVE

## 2020-07-25 NOTE — Progress Notes (Signed)
Presents for Covid-19 test and pre op blood work.  Denies Covid symptoms, test completed.  Pre op instructions reviewed,  body wash given, pt understanding verbalized.

## 2020-07-26 NOTE — Anesthesia Preprocedure Evaluation (Addendum)
Anesthesia Evaluation  Patient identified by MRN, date of birth, ID band Patient awake    Reviewed: Allergy & Precautions, H&P , NPO status , Patient's Chart, lab work & pertinent test results  Airway Mallampati: I  TM Distance: >3 FB Neck ROM: Full    Dental no notable dental hx. (+) Teeth Intact, Dental Advisory Given   Pulmonary neg pulmonary ROS,    Pulmonary exam normal breath sounds clear to auscultation       Cardiovascular Exercise Tolerance: Good negative cardio ROS Normal cardiovascular exam Rhythm:Regular Rate:Normal     Neuro/Psych negative neurological ROS  negative psych ROS   GI/Hepatic negative GI ROS, Neg liver ROS,   Endo/Other  negative endocrine ROSdiabetes, GestationalMorbid obesity  Renal/GU negative Renal ROS  negative genitourinary   Musculoskeletal negative musculoskeletal ROS (+)   Abdominal (+) + obese,   Peds negative pediatric ROS (+)  Hematology negative hematology ROS (+) Blood dyscrasia, anemia ,   Anesthesia Other Findings   Reproductive/Obstetrics negative OB ROS                            Anesthesia Physical Anesthesia Plan  ASA: III  Anesthesia Plan: Spinal   Post-op Pain Management:    Induction:   PONV Risk Score and Plan: Scopolamine patch - Pre-op  Airway Management Planned: Natural Airway  Additional Equipment: None  Intra-op Plan:   Post-operative Plan:   Informed Consent: I have reviewed the patients History and Physical, chart, labs and discussed the procedure including the risks, benefits and alternatives for the proposed anesthesia with the patient or authorized representative who has indicated his/her understanding and acceptance.       Plan Discussed with: Anesthesiologist and CRNA  Anesthesia Plan Comments: (  )       Anesthesia Quick Evaluation

## 2020-07-27 ENCOUNTER — Encounter (HOSPITAL_COMMUNITY): Payer: Self-pay | Admitting: Obstetrics and Gynecology

## 2020-07-27 ENCOUNTER — Inpatient Hospital Stay (HOSPITAL_COMMUNITY): Payer: BC Managed Care – PPO | Admitting: Anesthesiology

## 2020-07-27 ENCOUNTER — Encounter (HOSPITAL_COMMUNITY)
Admission: RE | Disposition: A | Discharge status: Home / Self Care | Payer: Self-pay | Source: Home / Self Care | Attending: Obstetrics and Gynecology

## 2020-07-27 ENCOUNTER — Inpatient Hospital Stay (HOSPITAL_COMMUNITY)
Admission: RE | Admit: 2020-07-27 | Discharge: 2020-07-31 | DRG: 787 | Disposition: A | Discharge status: Home / Self Care | Payer: BC Managed Care – PPO | Attending: Obstetrics and Gynecology | Admitting: Obstetrics and Gynecology

## 2020-07-27 DIAGNOSIS — O3663X0 Maternal care for excessive fetal growth, third trimester, not applicable or unspecified: Secondary | ICD-10-CM | POA: Diagnosis present

## 2020-07-27 DIAGNOSIS — Z20822 Contact with and (suspected) exposure to covid-19: Secondary | ICD-10-CM | POA: Diagnosis present

## 2020-07-27 DIAGNOSIS — O3663X4 Maternal care for excessive fetal growth, third trimester, fetus 4: Secondary | ICD-10-CM | POA: Diagnosis present

## 2020-07-27 DIAGNOSIS — D252 Subserosal leiomyoma of uterus: Secondary | ICD-10-CM | POA: Diagnosis present

## 2020-07-27 DIAGNOSIS — O339 Maternal care for disproportion, unspecified: Secondary | ICD-10-CM | POA: Diagnosis present

## 2020-07-27 DIAGNOSIS — O99893 Other specified diseases and conditions complicating puerperium: Secondary | ICD-10-CM | POA: Diagnosis not present

## 2020-07-27 DIAGNOSIS — R0602 Shortness of breath: Secondary | ICD-10-CM | POA: Diagnosis not present

## 2020-07-27 DIAGNOSIS — O3413 Maternal care for benign tumor of corpus uteri, third trimester: Principal | ICD-10-CM | POA: Diagnosis present

## 2020-07-27 DIAGNOSIS — R531 Weakness: Secondary | ICD-10-CM | POA: Diagnosis not present

## 2020-07-27 DIAGNOSIS — O1002 Pre-existing essential hypertension complicating childbirth: Secondary | ICD-10-CM | POA: Diagnosis present

## 2020-07-27 DIAGNOSIS — Z3A38 38 weeks gestation of pregnancy: Secondary | ICD-10-CM | POA: Diagnosis not present

## 2020-07-27 DIAGNOSIS — O24420 Gestational diabetes mellitus in childbirth, diet controlled: Secondary | ICD-10-CM | POA: Diagnosis present

## 2020-07-27 DIAGNOSIS — O99214 Obesity complicating childbirth: Secondary | ICD-10-CM | POA: Diagnosis present

## 2020-07-27 DIAGNOSIS — O99824 Streptococcus B carrier state complicating childbirth: Secondary | ICD-10-CM | POA: Diagnosis present

## 2020-07-27 DIAGNOSIS — O9902 Anemia complicating childbirth: Secondary | ICD-10-CM | POA: Diagnosis present

## 2020-07-27 DIAGNOSIS — O26893 Other specified pregnancy related conditions, third trimester: Secondary | ICD-10-CM | POA: Diagnosis present

## 2020-07-27 LAB — GLUCOSE, CAPILLARY
Glucose-Capillary: 72 mg/dL (ref 70–99)
Glucose-Capillary: 80 mg/dL (ref 70–99)

## 2020-07-27 SURGERY — Surgical Case
Anesthesia: Spinal | Site: Abdomen | Wound class: Clean Contaminated

## 2020-07-27 MED ORDER — PRENATAL MULTIVITAMIN CH
1.0000 | ORAL_TABLET | Freq: Every day | ORAL | Status: DC
  Administered 2020-07-28 – 2020-07-31 (×4): 1 via ORAL
  Filled 2020-07-27 (×4): qty 1

## 2020-07-27 MED ORDER — FENTANYL CITRATE (PF) 100 MCG/2ML IJ SOLN
INTRAMUSCULAR | Status: AC
  Filled 2020-07-27: qty 2

## 2020-07-27 MED ORDER — LACTATED RINGERS IV SOLN: INTRAVENOUS | Status: DC

## 2020-07-27 MED ORDER — MISOPROSTOL 200 MCG PO TABS
ORAL_TABLET | ORAL | Status: AC
  Filled 2020-07-27: qty 4

## 2020-07-27 MED ORDER — TRANEXAMIC ACID-NACL 1000-0.7 MG/100ML-% IV SOLN
INTRAVENOUS | Status: AC
  Filled 2020-07-27: qty 100

## 2020-07-27 MED ORDER — ONDANSETRON HCL 4 MG/2ML IJ SOLN: 4.0000 mg | Freq: Three times a day (TID) | INTRAMUSCULAR | Status: DC | PRN

## 2020-07-27 MED ORDER — MORPHINE SULFATE (PF) 0.5 MG/ML IJ SOLN
INTRAMUSCULAR | Status: AC
  Filled 2020-07-27: qty 10

## 2020-07-27 MED ORDER — OXYCODONE HCL 5 MG PO TABS
5.0000 mg | ORAL_TABLET | ORAL | Status: DC | PRN
  Administered 2020-07-29: 10 mg via ORAL
  Filled 2020-07-27: qty 2

## 2020-07-27 MED ORDER — DIPHENHYDRAMINE HCL 50 MG/ML IJ SOLN: 12.5000 mg | INTRAMUSCULAR | Status: DC | PRN

## 2020-07-27 MED ORDER — DEXAMETHASONE SODIUM PHOSPHATE 4 MG/ML IJ SOLN
INTRAMUSCULAR | Status: AC
  Filled 2020-07-27: qty 1

## 2020-07-27 MED ORDER — OXYTOCIN-SODIUM CHLORIDE 30-0.9 UT/500ML-% IV SOLN
INTRAVENOUS | Status: DC | PRN
  Administered 2020-07-27: 200 mL via INTRAVENOUS

## 2020-07-27 MED ORDER — NALBUPHINE HCL 10 MG/ML IJ SOLN: 5.0000 mg | INTRAMUSCULAR | Status: DC | PRN

## 2020-07-27 MED ORDER — ONDANSETRON HCL 4 MG/2ML IJ SOLN
INTRAMUSCULAR | Status: DC | PRN
  Administered 2020-07-27: 4 mg via INTRAVENOUS

## 2020-07-27 MED ORDER — NALOXONE HCL 4 MG/10ML IJ SOLN
1.0000 ug/kg/h | INTRAVENOUS | Status: DC | PRN
  Filled 2020-07-27: qty 5

## 2020-07-27 MED ORDER — ENOXAPARIN SODIUM 80 MG/0.8ML ~~LOC~~ SOLN
70.0000 mg | SUBCUTANEOUS | Status: DC
  Administered 2020-07-29 – 2020-07-31 (×3): 70 mg via SUBCUTANEOUS
  Filled 2020-07-27 (×4): qty 0.8

## 2020-07-27 MED ORDER — FERROUS SULFATE 325 (65 FE) MG PO TABS
325.0000 mg | ORAL_TABLET | Freq: Two times a day (BID) | ORAL | Status: DC
  Administered 2020-07-28 – 2020-07-31 (×7): 325 mg via ORAL
  Filled 2020-07-27 (×7): qty 1

## 2020-07-27 MED ORDER — OXYTOCIN-SODIUM CHLORIDE 30-0.9 UT/500ML-% IV SOLN: 2.5000 [IU]/h | INTRAVENOUS | Status: AC

## 2020-07-27 MED ORDER — TETANUS-DIPHTH-ACELL PERTUSSIS 5-2.5-18.5 LF-MCG/0.5 IM SUSP: 0.5000 mL | Freq: Once | INTRAMUSCULAR | Status: DC

## 2020-07-27 MED ORDER — OXYTOCIN-SODIUM CHLORIDE 30-0.9 UT/500ML-% IV SOLN
INTRAVENOUS | Status: AC
  Filled 2020-07-27: qty 500

## 2020-07-27 MED ORDER — DEXAMETHASONE SODIUM PHOSPHATE 4 MG/ML IJ SOLN
INTRAMUSCULAR | Status: DC | PRN
  Administered 2020-07-27: 4 mg via INTRAVENOUS

## 2020-07-27 MED ORDER — DEXTROSE 5 % IV SOLN
3.0000 g | INTRAVENOUS | Status: AC
  Administered 2020-07-27: 3 g via INTRAVENOUS

## 2020-07-27 MED ORDER — ONDANSETRON HCL 4 MG/2ML IJ SOLN
INTRAMUSCULAR | Status: AC
  Filled 2020-07-27: qty 2

## 2020-07-27 MED ORDER — SODIUM CHLORIDE 0.9 % IR SOLN
Status: DC | PRN
  Administered 2020-07-27: 1000 mL

## 2020-07-27 MED ORDER — MISOPROSTOL 200 MCG PO TABS
800.0000 ug | ORAL_TABLET | Freq: Once | ORAL | Status: AC
  Administered 2020-07-27: 800 ug via RECTAL

## 2020-07-27 MED ORDER — NALBUPHINE HCL 10 MG/ML IJ SOLN: 5.0000 mg | Freq: Once | INTRAMUSCULAR | Status: DC | PRN

## 2020-07-27 MED ORDER — IBUPROFEN 800 MG PO TABS
800.0000 mg | ORAL_TABLET | Freq: Three times a day (TID) | ORAL | Status: AC
  Administered 2020-07-27 – 2020-07-30 (×8): 800 mg via ORAL
  Filled 2020-07-27 (×7): qty 1

## 2020-07-27 MED ORDER — SIMETHICONE 80 MG PO CHEW
80.0000 mg | CHEWABLE_TABLET | Freq: Three times a day (TID) | ORAL | Status: DC
  Administered 2020-07-28 – 2020-07-31 (×8): 80 mg via ORAL
  Filled 2020-07-27 (×8): qty 1

## 2020-07-27 MED ORDER — DIPHENHYDRAMINE HCL 25 MG PO CAPS
25.0000 mg | ORAL_CAPSULE | ORAL | Status: DC | PRN
  Administered 2020-07-28 (×3): 25 mg via ORAL
  Filled 2020-07-27 (×3): qty 1

## 2020-07-27 MED ORDER — ACETAMINOPHEN 500 MG PO TABS
1000.0000 mg | ORAL_TABLET | Freq: Four times a day (QID) | ORAL | Status: DC
  Administered 2020-07-28 – 2020-07-31 (×14): 1000 mg via ORAL
  Filled 2020-07-27 (×14): qty 2

## 2020-07-27 MED ORDER — KETOROLAC TROMETHAMINE 30 MG/ML IJ SOLN: 30.0000 mg | Freq: Four times a day (QID) | INTRAMUSCULAR | Status: AC | PRN

## 2020-07-27 MED ORDER — NALOXONE HCL 0.4 MG/ML IJ SOLN: 0.4000 mg | INTRAMUSCULAR | Status: DC | PRN

## 2020-07-27 MED ORDER — DIBUCAINE (PERIANAL) 1 % EX OINT: 1.0000 "application " | TOPICAL_OINTMENT | CUTANEOUS | Status: DC | PRN

## 2020-07-27 MED ORDER — TRANEXAMIC ACID-NACL 1000-0.7 MG/100ML-% IV SOLN
1000.0000 mg | Freq: Once | INTRAVENOUS | Status: AC
  Administered 2020-07-27: 1000 mg via INTRAVENOUS

## 2020-07-27 MED ORDER — FENTANYL CITRATE (PF) 100 MCG/2ML IJ SOLN
INTRAMUSCULAR | Status: DC | PRN
Start: 2020-07-27 — End: 2020-07-27
  Administered 2020-07-27: 15 ug via INTRATHECAL

## 2020-07-27 MED ORDER — COCONUT OIL OIL: 1.0000 "application " | TOPICAL_OIL | Status: DC | PRN

## 2020-07-27 MED ORDER — ACETAMINOPHEN 500 MG PO TABS
ORAL_TABLET | ORAL | Status: AC
  Filled 2020-07-27: qty 2

## 2020-07-27 MED ORDER — DIPHENHYDRAMINE HCL 25 MG PO CAPS: 25.0000 mg | ORAL_CAPSULE | Freq: Four times a day (QID) | ORAL | Status: DC | PRN

## 2020-07-27 MED ORDER — PHENYLEPHRINE HCL-NACL 20-0.9 MG/250ML-% IV SOLN
INTRAVENOUS | Status: DC | PRN
  Administered 2020-07-27: 60 ug/min via INTRAVENOUS

## 2020-07-27 MED ORDER — SENNOSIDES-DOCUSATE SODIUM 8.6-50 MG PO TABS
2.0000 | ORAL_TABLET | ORAL | Status: DC
  Administered 2020-07-28 – 2020-07-31 (×4): 2 via ORAL
  Filled 2020-07-27 (×4): qty 2

## 2020-07-27 MED ORDER — SIMETHICONE 80 MG PO CHEW
80.0000 mg | CHEWABLE_TABLET | ORAL | Status: DC
  Administered 2020-07-28 – 2020-07-31 (×4): 80 mg via ORAL
  Filled 2020-07-27 (×4): qty 1

## 2020-07-27 MED ORDER — PHENYLEPHRINE 40 MCG/ML (10ML) SYRINGE FOR IV PUSH (FOR BLOOD PRESSURE SUPPORT)
PREFILLED_SYRINGE | INTRAVENOUS | Status: AC
  Filled 2020-07-27: qty 10

## 2020-07-27 MED ORDER — ACETAMINOPHEN 500 MG PO TABS
1000.0000 mg | ORAL_TABLET | ORAL | Status: AC
  Administered 2020-07-27: 1000 mg via ORAL

## 2020-07-27 MED ORDER — WITCH HAZEL-GLYCERIN EX PADS: 1.0000 "application " | MEDICATED_PAD | CUTANEOUS | Status: DC | PRN

## 2020-07-27 MED ORDER — ZOLPIDEM TARTRATE 5 MG PO TABS: 5.0000 mg | ORAL_TABLET | Freq: Every evening | ORAL | Status: DC | PRN

## 2020-07-27 MED ORDER — SCOPOLAMINE 1 MG/3DAYS TD PT72: 1.0000 | MEDICATED_PATCH | Freq: Once | TRANSDERMAL | Status: DC

## 2020-07-27 MED ORDER — MORPHINE SULFATE (PF) 0.5 MG/ML IJ SOLN
INTRAMUSCULAR | Status: DC | PRN
Start: 2020-07-27 — End: 2020-07-27
  Administered 2020-07-27: 150 ug via INTRATHECAL

## 2020-07-27 MED ORDER — MENTHOL 3 MG MT LOZG: 1.0000 | LOZENGE | OROMUCOSAL | Status: DC | PRN

## 2020-07-27 MED ORDER — BUPIVACAINE IN DEXTROSE 0.75-8.25 % IT SOLN
INTRATHECAL | Status: DC | PRN
  Administered 2020-07-27: 1.6 mL via INTRATHECAL

## 2020-07-27 MED ORDER — PHENYLEPHRINE HCL (PRESSORS) 10 MG/ML IV SOLN
INTRAVENOUS | Status: DC | PRN
  Administered 2020-07-27 (×4): 80 ug via INTRAVENOUS
  Administered 2020-07-27: 120 ug via INTRAVENOUS
  Administered 2020-07-27: 80 ug via INTRAVENOUS

## 2020-07-27 MED ORDER — NALBUPHINE HCL 10 MG/ML IJ SOLN
5.0000 mg | INTRAMUSCULAR | Status: DC | PRN
  Administered 2020-07-28: 5 mg via INTRAVENOUS
  Filled 2020-07-27: qty 1

## 2020-07-27 MED ORDER — DEXTROSE 5 % IV SOLN
INTRAVENOUS | Status: AC
  Filled 2020-07-27: qty 3000

## 2020-07-27 MED ORDER — SODIUM CHLORIDE 0.9% FLUSH: 3.0000 mL | INTRAVENOUS | Status: DC | PRN

## 2020-07-27 MED ORDER — STERILE WATER FOR IRRIGATION IR SOLN
Status: DC | PRN
  Administered 2020-07-27: 1000 mL

## 2020-07-27 MED ORDER — POVIDONE-IODINE 10 % EX SWAB: 2.0000 "application " | Freq: Once | CUTANEOUS | Status: DC

## 2020-07-27 MED ORDER — SIMETHICONE 80 MG PO CHEW: 80.0000 mg | CHEWABLE_TABLET | ORAL | Status: DC | PRN

## 2020-07-27 MED ORDER — PROPOFOL 10 MG/ML IV BOLUS
INTRAVENOUS | Status: AC
  Filled 2020-07-27: qty 20

## 2020-07-27 SURGICAL SUPPLY — 38 items
BENZOIN TINCTURE PRP APPL 2/3 (GAUZE/BANDAGES/DRESSINGS) ×2 IMPLANT
CHLORAPREP W/TINT 26ML (MISCELLANEOUS) ×2 IMPLANT
CLAMP CORD UMBIL (MISCELLANEOUS) IMPLANT
CLOSURE STERI STRIP 1/2 X4 (GAUZE/BANDAGES/DRESSINGS) ×2 IMPLANT
CLOTH BEACON ORANGE TIMEOUT ST (SAFETY) ×2 IMPLANT
DRAPE C SECTION CLR SCREEN (DRAPES) ×2 IMPLANT
DRESSING PREVENA PLUS CUSTOM (GAUZE/BANDAGES/DRESSINGS) ×1 IMPLANT
DRSG OPSITE POSTOP 4X10 (GAUZE/BANDAGES/DRESSINGS) ×2 IMPLANT
DRSG PREVENA PLUS CUSTOM (GAUZE/BANDAGES/DRESSINGS) ×2
ELECT REM PT RETURN 9FT ADLT (ELECTROSURGICAL) ×2
ELECTRODE REM PT RTRN 9FT ADLT (ELECTROSURGICAL) ×1 IMPLANT
EXTRACTOR VACUUM KIWI (MISCELLANEOUS) IMPLANT
GLOVE BIO SURGEON STRL SZ 6.5 (GLOVE) ×2 IMPLANT
GLOVE BIOGEL PI IND STRL 7.0 (GLOVE) ×2 IMPLANT
GLOVE BIOGEL PI INDICATOR 7.0 (GLOVE) ×2
GOWN STRL REUS W/TWL LRG LVL3 (GOWN DISPOSABLE) ×4 IMPLANT
KIT ABG SYR 3ML LUER SLIP (SYRINGE) IMPLANT
NEEDLE HYPO 25X5/8 SAFETYGLIDE (NEEDLE) IMPLANT
NS IRRIG 1000ML POUR BTL (IV SOLUTION) ×2 IMPLANT
PACK C SECTION WH (CUSTOM PROCEDURE TRAY) ×2 IMPLANT
PAD OB MATERNITY 4.3X12.25 (PERSONAL CARE ITEMS) ×2 IMPLANT
RETRACTOR WND ALEXIS 25 LRG (MISCELLANEOUS) ×1 IMPLANT
RTRCTR C-SECT PINK 25CM LRG (MISCELLANEOUS) IMPLANT
RTRCTR WOUND ALEXIS 25CM LRG (MISCELLANEOUS) ×2
STRIP CLOSURE SKIN 1/2X4 (GAUZE/BANDAGES/DRESSINGS) IMPLANT
SUT CHROMIC 1 CTX 36 (SUTURE) ×4 IMPLANT
SUT PLAIN 0 NONE (SUTURE) IMPLANT
SUT PLAIN 2 0 XLH (SUTURE) ×2 IMPLANT
SUT VIC AB 0 CT1 27 (SUTURE) ×2
SUT VIC AB 0 CT1 27XBRD ANBCTR (SUTURE) ×2 IMPLANT
SUT VIC AB 2-0 CT1 27 (SUTURE) ×1
SUT VIC AB 2-0 CT1 TAPERPNT 27 (SUTURE) ×1 IMPLANT
SUT VIC AB 3-0 CT1 27 (SUTURE)
SUT VIC AB 3-0 CT1 TAPERPNT 27 (SUTURE) IMPLANT
SUT VIC AB 4-0 KS 27 (SUTURE) ×2 IMPLANT
TOWEL OR 17X24 6PK STRL BLUE (TOWEL DISPOSABLE) ×2 IMPLANT
TRAY FOLEY W/BAG SLVR 14FR LF (SET/KITS/TRAYS/PACK) ×2 IMPLANT
WATER STERILE IRR 1000ML POUR (IV SOLUTION) ×2 IMPLANT

## 2020-07-27 NOTE — Transfer of Care (Signed)
Immediate Anesthesia Transfer of Care Note  Patient: Rhonda Turner  Procedure(s) Performed: CESAREAN SECTION (N/A Abdomen)  Patient Location: PACU  Anesthesia Type:Spinal  Level of Consciousness: awake and patient cooperative  Airway & Oxygen Therapy: Patient Spontanous Breathing  Post-op Assessment: Report given to RN and Post -op Vital signs reviewed and stable  Post vital signs: Reviewed and stable  Last Vitals:  Vitals Value Taken Time  BP 96/50 07/27/20 1752  Temp    Pulse 81 07/27/20 1755  Resp 18 07/27/20 1755  SpO2 95 % 07/27/20 1755  Vitals shown include unvalidated device data.  Last Pain:  Vitals:   07/27/20 1503  TempSrc: Oral  PainSc: 0-No pain      Patients Stated Pain Goal: 5 (89/79/15 0413)  Complications: No complications documented.

## 2020-07-27 NOTE — H&P (Signed)
Rhonda Turner is a 29 y.o.prime  female presenting for scheduled primary cesarean section. Pt is dated per 8week [place]. Pt had GDM in pregnancy; controlled till 326 weeks. Pt also with large fibroids with one of concern in lower uterine segment. Concern for blockage of passageway with vaginal delivery. Discussed options with pt and opted for delivery via primary cesarean section after risks/benefits of both options reviewed. Pt also with chronic maternal hypertension - stable; no meds needed. She has anemia of pregnancy - received fereheme. Hx depression. Declined essential and panorama screening OB History    Gravida  1   Para  0   Term  0   Preterm  0   AB  0   Living  0     SAB  0   TAB  0   Ectopic  0   Multiple  0   Live Births             Past Medical History:  Diagnosis Date  . Amenorrhea    began skipping cycles 09/2015  . Anemia 2016  . Fibroid   . Gestational diabetes   . Heart murmur    --as a child-- no current workup  . Hyperlipidemia 2017   Past Surgical History:  Procedure Laterality Date  . NO PAST SURGERIES     Family History: family history includes Diabetes in her mother; Hypertension in her maternal grandfather and mother; Other in her mother; Stroke in her maternal grandmother. Social History:  reports that she has never smoked. She has never used smokeless tobacco. She reports previous alcohol use of about 2.0 standard drinks of alcohol per week. She reports that she does not use drugs.     Maternal Diabetes: Yes:  Diabetes Type:  Diet controlled Genetic Screening: Declined Maternal Ultrasounds/Referrals: Normal Fetal Ultrasounds or other Referrals:  None Maternal Substance Abuse:  No Significant Maternal Medications:  None Significant Maternal Lab Results:  Group B Strep negative Other Comments:  None  Review of Systems  Constitutional: Positive for fatigue. Negative for activity change and appetite change.  Eyes: Negative for  photophobia and visual disturbance.  Respiratory: Positive for shortness of breath. Negative for chest tightness.   Cardiovascular: Positive for leg swelling. Negative for chest pain and palpitations.  Gastrointestinal: Negative for abdominal pain.  Musculoskeletal: Negative for myalgias.  Neurological: Negative for seizures and numbness.  Psychiatric/Behavioral: The patient is nervous/anxious.    Maternal Medical History:  Reason for admission: Scheduled primary cesarean section  Contractions: Frequency: rare.   Perceived severity is mild.    Fetal activity: Perceived fetal activity is normal.    Prenatal complications: no prenatal complications Prenatal Complications - Diabetes: gestational. Diabetes is managed by oral agent (monotherapy).        Blood pressure (!) 136/97, pulse 90, temperature 98.7 F (37.1 C), temperature source Oral, resp. rate 18, height 5\' 5"  (1.651 m), weight 134.3 kg, SpO2 100 %. Maternal Exam:  Uterine Assessment: Contraction strength is mild.  Contraction frequency is rare.   Abdomen: Patient reports generalized tenderness.  Estimated fetal weight is LGA.   Fetal presentation: vertex  Introitus: Normal vulva. Vulva is negative for condylomata and lesion.  Normal vagina.  Vagina is negative for condylomata.  Pelvis: of concern for delivery.      Fetal Exam Fetal Monitor Review: Baseline rate: 140.  Variability: moderate (6-25 bpm).   Pattern: accelerations present and no decelerations.    Fetal State Assessment: Category I - tracings are normal.  Physical Exam Abdominal:     Tenderness: There is generalized abdominal tenderness.  Genitourinary:    General: Normal vulva.  Vulva is no lesion.     Prenatal labs: ABO, Rh: --/--/O POS (09/07 0850) Antibody: NEG (09/07 0850) Rubella: Immune (02/09 0000) RPR: NON REACTIVE (09/07 0850)  HBsAg: Negative (02/09 0000)  HIV: Non-reactive (02/09 0000)  GBS: Positive/-- (08/24 0000)    Assessment/Plan: Admit  To OR when ready Consent verified ERAS protocol   Venetia Night Carlee Tesfaye 07/27/2020, 4:33 PM

## 2020-07-27 NOTE — Progress Notes (Signed)
BP 127/79   Pulse 85   Temp 97.6 F (36.4 C) (Oral)   Resp (!) 22   Ht 5\' 5"  (1.651 m)   Wt 134.3 kg   SpO2 98%   Breastfeeding Unknown   BMI 49.26 kg/m  Called to bedside by PACU for evaluation of bleeding. Concern regarding Honeycomb dressing, appears saturated with bleeding. Fundus firm about 9-2BB above umbilicus c/w known fibroids. Minimal trickling with fundal massage. Given known leiomyomatous burden, TXA and 839mcg PR cytotec ordered both once. Dressing removed and Prevena dressing applied without issue, suction intact. Patient tolerated dressing change well

## 2020-07-27 NOTE — Anesthesia Postprocedure Evaluation (Signed)
Anesthesia Post Note  Patient: Rhonda Turner  Procedure(s) Performed: CESAREAN SECTION (N/A Abdomen)     Patient location during evaluation: PACU Anesthesia Type: Spinal Level of consciousness: oriented and awake and alert Pain management: pain level controlled Vital Signs Assessment: post-procedure vital signs reviewed and stable Respiratory status: spontaneous breathing, respiratory function stable and patient connected to nasal cannula oxygen Cardiovascular status: blood pressure returned to baseline and stable Postop Assessment: no headache, no backache and no apparent nausea or vomiting Anesthetic complications: no   No complications documented.  Last Vitals:  Vitals:   07/27/20 2015 07/27/20 2030  BP: 124/77 124/81  Pulse: 72 75  Resp: 13 19  Temp:    SpO2: 98% 97%    Last Pain:  Vitals:   07/27/20 1900  TempSrc:   PainSc: 0-No pain                 Tanda Morrissey

## 2020-07-27 NOTE — Consult Note (Signed)
Neonatology Note:   Attendance at C-section:    I was asked by Dr. Terri Piedra to attend this C/S at term with fetal bradycardia just PTD. The mother is a G1, GBS + with good prenatal care complicated by obesity and GDM. ROM at delivery, fluid clear.  Body cord.   Infant not vigorous nor with good spontaneous cry and tone. Cord immediately clamped and baby brought to warmer. Dried and stimulated with improved color.  HR > 100 with persistent hypotonia and lack of resp effort.  PPV initiated and SaO2 placed for oxygen titration.  PPV provided for ~3-4 minutes until adequate spontaneous respirations demosntrated.  Tone, color and affect also gradually improved during this time. Deep suctioned through both nares ~10cc of mucous, bloody fluid.  CPAP 6cm ~40-50% required to maintain comfortable WOB and appropriate Sao2.  Unable to wean support through 17 minutes of life; decision made to transport to NICU.   Ap 3/7/8. Father updated throughout resuscitation and accompanied [place] to NICU where he was updated further. No issues during transfer.   Monia Sabal Katherina Mires, MD

## 2020-07-27 NOTE — Progress Notes (Signed)
Infant in NICU, mother desires to breast feed. RN set up DEBP, educated on setup, frequency of use, sanitization, and storage of milk. Evangeline Dakin

## 2020-07-27 NOTE — Anesthesia Procedure Notes (Signed)
Date/Time: 07/27/2020 4:49 PM Performed by: Vista Deck, CRNA Pre-anesthesia Checklist: Patient identified, Emergency Drugs available, Suction available, Timeout performed and Patient being monitored Patient Re-evaluated:Patient Re-evaluated prior to induction Oxygen Delivery Method: Non-rebreather mask

## 2020-07-27 NOTE — Anesthesia Procedure Notes (Signed)
Spinal  Patient location during procedure: OR Start time: 07/27/2020 4:40 PM End time: 07/27/2020 4:45 PM Staffing Anesthesiologist: Janeece Riggers, MD Preanesthetic Checklist Completed: patient identified, IV checked, site marked, risks and benefits discussed, surgical consent, monitors and equipment checked, pre-op evaluation and timeout performed Spinal Block Patient position: sitting Prep: DuraPrep Patient monitoring: heart rate, cardiac monitor, continuous pulse ox and blood pressure Approach: midline Location: L3-4 Injection technique: single-shot Needle Needle type: Sprotte  Needle gauge: 24 G Needle length: 9 cm Assessment Sensory level: T4

## 2020-07-28 DIAGNOSIS — O3663X4 Maternal care for excessive fetal growth, third trimester, fetus 4: Secondary | ICD-10-CM | POA: Diagnosis present

## 2020-07-28 LAB — CBC
HCT: 32.8 % — ABNORMAL LOW (ref 36.0–46.0)
Hemoglobin: 9.8 g/dL — ABNORMAL LOW (ref 12.0–15.0)
MCH: 26.3 pg (ref 26.0–34.0)
MCHC: 29.9 g/dL — ABNORMAL LOW (ref 30.0–36.0)
MCV: 88.2 fL (ref 80.0–100.0)
Platelets: 172 10*3/uL (ref 150–400)
RBC: 3.72 MIL/uL — ABNORMAL LOW (ref 3.87–5.11)
RDW: 29 % — ABNORMAL HIGH (ref 11.5–15.5)
WBC: 10.4 10*3/uL (ref 4.0–10.5)
nRBC: 0 % (ref 0.0–0.2)

## 2020-07-28 LAB — CREATININE, SERUM
Creatinine, Ser: 0.76 mg/dL (ref 0.44–1.00)
GFR calc Af Amer: >60 mL/min (ref >60)
GFR calc non Af Amer: >60 mL/min (ref >60)

## 2020-07-28 NOTE — Consult Note (Addendum)
Requested to evaluate patient by CRNA after postoperative check today for concern of lower extremity weakness.   Upon arrival, patient sitting upright in bed with legs dangling off side of bed, in no distress. Interviewed patient. History of chronic hypertension on no medications, no history of bleeding problems, pre-eclampsia, or anticoagulant use. Per her recollection, after the spinal was placed, she experienced significant SOB, was unable to speak, had brief visual loss, and could not move her arms. She states she could hear everything people were saying to her, but could only move her head to respond. Patient states this did not resolve until several hours later in PACU. Looking at the record, patient was not intubated during the procedure and respiratory vitals appear to have been ok. High spinal noted with decreased hand grip, but per Quick note on anesthesia record at 17:00 (15 minutes after comments regarding grip strength), states that patient said she was feeling "ok" and was "breathing ok" as well.   Patient tells me that her sensation returned to normal within a few hours of the procedure, however, her motor function has not returned to normal. She states her upper body strength returned overnight, but she is unable to move from the waist down and has not been able to since the procedure. She has not experienced any incontinence. She has been up to the bathroom at least 2 times with assistance, not requiring a wheelchair.   On exam, sensation noted to be intact bilaterally to all dermatomes. Motor exam notable for 1/5 strength bilaterally to hip flexion and extension, knee flexion and extension, and ankle plantar and dorsiflexion.   Based on story and exam, I believe spinal hematoma is low on the differential. Explained to patient my findings on exam, the concern of hematoma development, definitive evaluation for hematoma, and my reasoning as to why I did not feel an urgent MRI was necessary.  Recommended patient continue to try moving her legs throughout the day and to call for assistance anytime she wants to get out of bed. Also discussed with patient's nurse, who stated patient has been unwilling or unable to move her legs beyond wiggling her toes and rotating her feet at the ankle so far today, though she was able to use assistance to make it to the restroom. Will reassess later in afternoon and reconsider MRI if weakness does not improve. Patient expressed understanding and was agreeable to this plan.   Update: Reassessed patient around 5:30pm. Patient remains weak, but some improvement from earlier assessment. Now 3/5 strength bilateral lower extremities. Patient agrees that her weakness is improved subjectively. No change in sensory status, no incontinence. Will continue to monitor patient. She was instructed to alert her RN immediately for any worsened weakness, loss of sensation, or incontinence.

## 2020-07-28 NOTE — Progress Notes (Signed)
Subjective: Postpartum Day #1: Cesarean Delivery Patient reports incisional pain, tolerating PO and no problems voiding.    Objective: Vital signs in last 24 hours: Temp:  [97.6 F (36.4 C)-98.7 F (37.1 C)] 97.9 F (36.6 C) (09/10 0500) Pulse Rate:  [65-102] 85 (09/10 0004) Resp:  [3-25] 20 (09/10 0500) BP: (96-152)/(50-97) 132/90 (09/09 2225) SpO2:  [95 %-100 %] 98 % (09/10 0500) Weight:  [134.3 kg] 134.3 kg (09/09 1503)  Physical Exam:  General: alert Lochia: appropriate Uterine Fundus: firm Incision: neg pressure dressing intact   Recent Labs    07/25/20 0850 07/28/20 0547  HGB 11.4* 9.8*  HCT 39.2 32.8*    Assessment/Plan: Status post Cesarean section. Doing well postoperatively.  Continue current care, ambulate.  Rhonda Turner 07/28/2020, 8:30 AM

## 2020-07-28 NOTE — Lactation Note (Signed)
This note was copied from a baby's chart. Lactation Consultation Note  Patient Name: Rhonda Turner [id number] Date: 07/28/2020 Reason for consult: Initial assessment;Primapara;1st time breastfeeding;NICU baby;Early term 25-38.6wks 64 22hrs old, baby in NICU, mom sitting in bed, dad sitting on couch. Mom reports pumping is going ok, feels weird, states nothing is coming out of breasts, reports last pumped at 1:30pm. This LC taught hand expression, no colostrum noted, encouraged mom to pump q3hrs and perform hand expression afterwards. Reviewed milk collection and storage guidelines, vials given. Mom reports goal is to exclusively BF until return to work in December, then desires to offer EBM in bottle. Mom reports having an [employer] DEBP at home. Reports visiting NICU and has latched baby. Encouraged mom to notify NICU RN of plans to visit in order to schedule an appt with NICU-LC and pump breasts prior to latching. Advised when baby is ready feedings are by cue, expect 8-12 feedings in 24hrs, wake if >3hrs since last feeding, optimal skin to skin and benefits, community BF support groups, [facility] BF brochure with numbers for [facility] telephone and outpatient support. Mom voiced understanding and with no further concerns. BGilliam, RN, IBCLC  Maternal Data Has patient been taught Hand Expression?: Yes Does the patient have breastfeeding experience prior to this delivery?: No  Feeding  LATCH Score                   Interventions Interventions: Breast feeding basics reviewed;Skin to skin;Hand express;Breast massage;Expressed milk;DEBP  Lactation Tools Discussed/Used WIC Program: No Pump Review: Setup, frequency, and cleaning Initiated by:: Mother Baby RN Date initiated:: 07/27/20   Consult Status Consult Status: Follow-up Date: 07/29/20 Follow-up type: In-patient    Rhonda Turner 07/28/2020, 2:58 PM

## 2020-07-28 NOTE — Addendum Note (Signed)
Addendum  created 07/28/20 1743 by Audry Pili, MD   Clinical Note Signed

## 2020-07-28 NOTE — Lactation Note (Signed)
This note was copied from a baby's chart. Lactation Consultation Note Mom sleeping soundly.  Noted DEBP set up at bedside. [place] left NICU booklet and Lactation brochure at bedside.  Patient Name: Rhonda Turner [id number] Date: 07/28/2020     Maternal Data    Feeding Feeding Type: Bottle Fed - Formula Nipple Type: Nfant Slow Flow (purple)  LATCH Score                   Interventions    Lactation Tools Discussed/Used     Consult Status      Asya Derryberry G 07/28/2020, 4:36 AM

## 2020-07-28 NOTE — Clinical Social Work Maternal (Signed)
CLINICAL SOCIAL WORK MATERNAL/CHILD NOTE  Patient Details  Name: Rhonda Turner MRN: [id number] Date of Birth: 10/15/1991  Date:  07/28/2020  Clinical Social Worker Initiating Note:  Vivia Rosenburg Boyd-Gilyard Date/Time: Initiated:  07/28/20/1121     Child's Name:  Rhonda Turner   Biological Parents:  Mother, Father   Need for Interpreter:  None   Reason for Referral:  Behavioral Health Concerns (Hx of anxiety)   Address:  [address] [place] [address]    Phone number:  [phone] (home)     Additional phone number:FOB's number is [phone]  Household Members/Support Persons (HM/SP):   Household Member/Support Person 1   HM/SP Name Relationship DOB or Age  HM/SP -1 Clayton Turner FOB/Husband 11/14/1989  HM/SP -2        HM/SP -3        HM/SP -4        HM/SP -5        HM/SP -6        HM/SP -7        HM/SP -8          Natural Supports (not living in the home):  Extended Family, Immediate Family, Parent (The couple reported that have several supporters from  distance.)   Professional Supports: None   Employment: Full-time   Type of Work: Dance Teacher at [employer]   Education:  Graduate degree   Homebound arranged:    Financial Resources:  Private Insurance   Other Resources:   (CSW provided MOB information to apply for Medicaid.)   Cultural/Religious Considerations Which May Impact Care:  Per MOB's Face Sheet, MOB is Christian  Strengths:  Ability to meet basic needs , Home prepared for child , Understanding of illness   Psychotropic Medications:         Pediatrician:    [place] area  Pediatrician List:    [facility]  [place]    [place]    [place]    [place]    [place]      Pediatrician Fax Number:    Risk Factors/Current Problems:  Mental Health Concerns    Cognitive State:  Alert , Insightful , Linear Thinking , Goal Oriented    Mood/Affect:  Interested  , Comfortable , Happy , Relaxed    CSW Assessment: CSW met with MOB and FOB at infant's bedside. When CSW arrived, parents were observing infant while infant was asleep in his isolette. The couple was supportive of one another and appeared to be happy and excited to spend time infant.  MOB was polite, easy to engage, and receptive to meeting with CSW.  CSW inquired about MOB's birthing experience and MOB reported, "It was traumatic."  "I was unable to see after receiving my epidural and that was scary for me." CSW validated and normalized MOB's thoughts and feelings and also reviewed other emotions that MOB may experience during the postpartum period. CSW inquired about MOB's MH hx  and MOB acknowledged a hx of anxiety and reported that she was dx in 2019. Per MOB, she is not currently taking any medications. CSW educated MOB about PMADs. CSW informed MOB of possible supports and interventions to decrease PPD.  CSW also encouraged MOB to seek medical attention if needed for increased signs and symptoms of PPD.  CSW also offered MOB resources for outpatient behavioral health services and MOB declined. CSW encouraged MOB to evaluate her mental health throughout the postpartum period with the use of the New   Mom Checklist developed by Postpartum Progress and notify a medical professional if symptoms arise; MOB agreed. MOB presented with insight and awareness and denied SI and HI. MOB reported having a good support team that will be willing to help if needed. CSW reviewed safe sleep and SIDS. MOB and FOB were knowledgeable and asked appropriate questions. MOB communicated that the family has everything they need for the baby and is prepared to meet his needs.  The couple did not have any further questions, concerns, or needs currently. CSW thanked MOB for allowing CSW to meet with them  CSW will continue to offer resources and supports to family while infant remains in NICU.    CSW Plan/Description:   Psychosocial Support and Ongoing Assessment of Needs, Sudden Infant Death Syndrome (SIDS) Education, Perinatal Mood and Anxiety Disorder (PMADs) Education, Other Patient/Family Education   Laurey Arrow, MSW, LCSW Clinical Social Work [phone]  Dimple Nanas, LCSW 07/28/2020, 11:29 AM

## 2020-07-29 NOTE — Addendum Note (Signed)
Addendum  created 07/29/20 1255 by Audry Pili, MD   Clinical Note Signed

## 2020-07-29 NOTE — Progress Notes (Signed)
Patient seen this afternoon. Doing well, now ambulating without significant difficulty. Vastly improved from yesterday. Patient states strength is near baseline. No changes to sensory status or development of incontinence. All questions answered to patient's satisfaction.

## 2020-07-29 NOTE — Progress Notes (Signed)
POD #2 LTCS Feeling better, better leg strength, able to ambulate Afeb, VSS Abd- soft, fundus firm, incision intact Continue routine care, ambulate

## 2020-07-30 NOTE — Progress Notes (Signed)
POD #3 LTCS Doing ok, ambulating, wants to stay today to work on breastfeeding Afeb, VSS Abd- soft, fundus firm, neg pressure dressing intact Continue routine care, plan for discharge tomorrow

## 2020-07-30 NOTE — Lactation Note (Signed)
This note was copied from a baby's chart. Lactation Consultation Note  Patient Name: Rhonda Turner OKHTX'H Date: 07/30/2020 Reason for consult: Follow-up assessment   Baby 24 hours old and has returned from NICU for low blood sugar and increased RR, decreased output. Mother really wants to breastfeed but states baby is fussy at the breast. Baby recently received full bottle of formula.  Suggest parents call when baby is cueing. Mother called.  Reviewed hand expression with good flow. Assisted with latching baby on R breast in football hold. Baby latched easily with intermittent swallows. Encouraged mother to compress breast during feeding to keep baby active. Plan is to put baby to the breast and post pump after.  Give volume back to baby. If baby breastfeeds on both breasts and is still cueing mother has option of giving formula. Reviewed volume guidelines which are more appropriate for baby 18-25 ml. Feed on demand with cues.  Goal 8-12+ times per day after first 24 hrs.  Place baby STS if not cueing.  Suggest mother call if further assistance is needed.   Maternal Data Has patient been taught Hand Expression?: Yes  Feeding Feeding Type: Breast Fed  LATCH Score Latch: Grasps breast easily, tongue down, lips flanged, rhythmical sucking.  Audible Swallowing: A few with stimulation  Type of Nipple: Everted at rest and after stimulation  Comfort (Breast/Nipple): Soft / non-tender  Hold (Positioning): Assistance needed to correctly position infant at breast and maintain latch.  LATCH Score: 8  Interventions Interventions: Breast feeding basics reviewed;Assisted with latch;Skin to skin;Hand express;DEBP  Lactation Tools Discussed/Used     Consult Status Consult Status: Follow-up Date: 07/31/20 Follow-up type: In-patient    Vivianne Master [facility] 07/30/2020, 10:15 AM

## 2020-07-30 NOTE — Lactation Note (Signed)
This note was copied from a baby's chart. Lactation Consultation Note  Patient Name: Rhonda Turner PBDHD'I Date: 07/30/2020   Randel Books is back in room after NICU.  Baby received lots of formula in the NICU and now is not very interested at the breast per mother. Encouraged lots of STS and mother needs to pump at least q 3 hours. Mother will call for [facility] to assist with next feeding.  Discussed paced bottle feeding. Feed on demand with cues.  Goal 8-12+ times per day after first 24 hrs.  Place baby STS if not cueing.        Maternal Data    Feeding Feeding Type: Bottle Fed - Formula Nipple Type: Slow - flow  LATCH Score                   Interventions    Lactation Tools Discussed/Used     Consult Status      Carlye Grippe 07/30/2020, 9:33 AM

## 2020-07-30 NOTE — Progress Notes (Signed)
CSW received consult due to score 11 on Edinburgh Depression Screen.  CSW visited MOB at bedside to offer support and assess for safety. On arrival, CSW introduced self and stated visit purpose. FOB and infant Earnestine Mealing were present, however, FOB stepped out of room to offer MOB privacy during assessment. MOB and FOB were pleasant during visit.   CSW encouraged MOB to evaluate her mental health throughout the postpartum period with the use of the New Mom Checklist developed by Postpartum Progress as well as the [place] Postnatal Depression Scale and notify a medical professional if symptoms arise  MOB stated score is related to being overwhelmed with birthing experience, infant's NICU visit, and breastfeeding barriers. MOB stated "I didn't know it was going to go like this." CSW provided space to express feeling, validated feelings, and offered encouragement. MOB denied any SI, HI, or domestic violence. MOB stated main issue at this time is baby's brief latching. As MOB and CSW were talking the lactation consultant enter the room. MOB was happy to see the support. CSW end visit and allowed lactation to offer support to MOB. CSW encouraged MOB to request CSW return if needed. MOB was appreciative.     Tyna Huertas D. Lissa Morales, MSW, LCSW Clinical Social Worker [phone] .

## 2020-07-30 NOTE — Lactation Note (Signed)
This note was copied from a baby's chart. Lactation Consultation Note  Patient Name: Boy Tarin Johndrow LFYBO'F Date: 7/51/0258   RN Elmo Putt reported to [facility] that mom has decided not to BF. She said she had an emotional breakdown right after shift change and told her that she won't be doing any BF. Baby is currently on Similac 20 calorie formula, LC services no longer needed.  Maternal Data    Feeding Feeding Type: Bottle Fed - Formula Nipple Type: Slow - flow  LATCH Score                   Interventions    Lactation Tools Discussed/Used     Consult Status      Chaysen Tillman Francene Boyers 07/30/2020, 10:52 PM

## 2020-07-31 LAB — SURGICAL PATHOLOGY

## 2020-07-31 MED ORDER — ACETAMINOPHEN 500 MG PO TABS: 1000.0000 mg | ORAL_TABLET | Freq: Four times a day (QID) | ORAL | 0 refills | Status: AC

## 2020-07-31 NOTE — Progress Notes (Signed)
At shift change report at [facility], pt very emotional and tearful about breastfeeding and frustrations with getting baby to latch and be satisfied. Assisted pt with latching baby and baby fed for 20 minutes fairly well and seems content. Pt cried for length of feeding, stating she didn't know if she could do it and that it hurts. Comfort gels given and use explained. Pt continued to express concerns about baby not being content with breastfeeding. Discussed options for feeding baby, to solely breastfeed/pump, breastfeeding with formula supplementation, or just formula feeding. Pt states she doesn't think she can do breastfeeding or pumping. Encouraged pt and significant other to make decision that works best for them and that it will be supported. Pt states she wants to just formula feed. Discussed wearing supportive bra, ice to breasts for discomfort. Pt expressed understanding. In to do pt's assessment at 2235 and pt was emotionally in a better place. She was able to smile and laugh and reported that feeding decision made her feel much better. [place] notified of pt's feeding choice change.

## 2020-07-31 NOTE — Progress Notes (Signed)
Subjective: Postpartum Day 4: Cesarean Delivery Patient reports tolerating PO and no problems voiding.  She has decided to bottlefeed and feels 100% better.  Pain controlled and ambulating.  Objective: Vital signs in last 24 hours: Temp:  [98.5 F (36.9 C)-99.1 F (37.3 C)] 98.5 F (36.9 C) (09/13 0619) Pulse Rate:  [90-109] 100 (09/13 0619) Resp:  [19-20] 20 (09/13 0619) BP: (114-140)/(60-80) 114/60 (09/13 0619) SpO2:  [100 %] 100 % (09/13 5396)  Physical Exam:  General: alert and cooperative Lochia: appropriate Uterine Fundus: firm Incision: C/D/I  Vacuum dressing in place   No results for input(s): HGB, HCT in the last 72 hours. Hgb 07/28/20 9.8  Assessment/Plan: Status post Cesarean section. Doing well postoperatively.  Discharge home with standard precautions and return to clinic in 2 days for BP check and one week to remove vacuum dressing  Logan Bores 07/31/2020, 8:17 AM

## 2020-07-31 NOTE — Discharge Summary (Signed)
Postpartum Discharge Summary       Patient Name: Rhonda Turner DOB: 26-Jan-1991 MRN: [id number]  Date of admission: 07/27/2020 Delivery date:07/27/2020  Delivering provider: Sherlyn Hay  Date of discharge: 07/31/2020  Admitting diagnosis: Postpartum care following cesarean delivery [Z39.2] LGA (large for gestational age) fetus affecting management of mother, third trimester, fetus 66 [O36.63X4] Intrauterine pregnancy: [redacted]w[redacted]d     Secondary diagnosis:  Active Problems:   Postpartum care following cesarean delivery   LGA (large for gestational age) fetus affecting management of mother, third trimester, fetus 57 Gestational diabetes-Diet-controlled CHTN stable without medication      Discharge diagnosis: Term Pregnancy Delivered                                              Post partum procedures:none   Hospital course: Sceduled C/S   29 y.o. yo G1P1001 at [redacted]w[redacted]d was admitted to the hospital 07/27/2020 for scheduled cesarean section with the following indication:Macrosomia.Delivery details are as follows:  Membrane Rupture Time/Date: 4:57 PM ,07/27/2020   Delivery Method:C-Section, Low Transverse  Details of operation can be found in separate operative note.  Patient had an uncomplicated postpartum course.  She is ambulating, tolerating a regular diet, passing flatus, and urinating well. Patient is discharged home in stable condition on  07/31/20        Newborn Data: Birth date:07/27/2020  Birth time:4:58 PM  Gender:Female  Living status:Living  Apgars:3 ,7  Weight:4280 g     Magnesium Sulfate received: No BMZ received: No Rhophylac:No   Physical exam  Vitals:   07/30/20 0550 07/30/20 1409 07/30/20 2129 07/31/20 0619  BP: 128/64 127/77 140/80 114/60  Pulse:  90 (!) 109 100  Resp: 18 19 20 20   Temp: 98.3 F (36.8 C) 99.1 F (37.3 C) 99 F (37.2 C) 98.5 F (36.9 C)  TempSrc: Oral Oral Oral Oral  SpO2: 100% 100% 100% 100%  Weight:      Height:       General:  alert and cooperative Lochia: appropriate Uterine Fundus: firm Incision: Dressing is clean, dry, and intact DVT Evaluation: No evidence of DVT seen on physical exam. Labs: Lab Results  Component Value Date   WBC 10.4 07/28/2020   HGB 9.8 (L) 07/28/2020   HCT 32.8 (L) 07/28/2020   MCV 88.2 07/28/2020   PLT 172 07/28/2020   CMP Latest Ref Rng & Units 07/28/2020  Glucose 70 - 99 mg/dL -  BUN 6 - 20 mg/dL -  Creatinine 0.44 - 1.00 mg/dL 0.76  Sodium 135 - 145 mmol/L -  Potassium 3.5 - 5.1 mmol/L -  Chloride 98 - 111 mmol/L -  CO2 22 - 32 mmol/L -  Calcium 8.9 - 10.3 mg/dL -  Total Protein 6.5 - 8.1 g/dL -  Total Bilirubin 0.3 - 1.2 mg/dL -  Alkaline Phos 38 - 126 U/L -  AST 15 - 41 U/L -  ALT 0 - 44 U/L -   Edinburgh Score: Edinburgh Postnatal Depression Scale Screening Tool 07/29/2020  I have been able to laugh and see the funny side of things. 1  I have looked forward with enjoyment to things. 1  I have blamed myself unnecessarily when things went wrong. 1  I have been anxious or worried for no good reason. 2  I have felt scared or panicky for no good reason. 2  Things have been getting on top of me. 2  I have been so unhappy that I have had difficulty sleeping. 0  I have felt sad or miserable. 1  I have been so unhappy that I have been crying. 1  The thought of harming myself has occurred to me. 0  Edinburgh Postnatal Depression Scale Total 11     After visit meds:  Allergies as of 07/31/2020      Reactions   Shellfish Allergy Hives      Medication List    TAKE these medications   acetaminophen 500 MG tablet Commonly known as: TYLENOL Take 2 tablets (1,000 mg total) by mouth every 6 (six) hours.   prenatal multivitamin Tabs tablet Take 1 tablet by mouth every evening.        Discharge home in stable condition Infant Feeding: Bottle Infant Disposition:home with mother Discharge instruction: per After Visit Summary and Postpartum booklet. Activity:  Advance as tolerated. Pelvic rest for 6 weeks.  Diet: routine diet Future Appointments:No future appointments. Follow up Visit:  [place], [place], DO. Schedule an appointment as soon as possible for a visit in 2 day(s).   Specialty: Obstetrics and Gynecology Why: BP check as planned Contact information: [place] [address] [phone]                Please schedule this patient for a In person postpartum visit in 2 weeks with the following provider: MD.  BP check  Delivery mode:  C-Section, Low Transverse  Anticipated Birth Control:  Unsure   07/31/2020 Logan Bores, MD

## 2022-04-05 ENCOUNTER — Emergency Department (HOSPITAL_COMMUNITY)
Admission: EM | Admit: 2022-04-05 | Discharge: 2022-04-05 | Disposition: A | Discharge status: Home / Self Care | Payer: No Typology Code available for payment source | Attending: Emergency Medicine | Admitting: Emergency Medicine

## 2022-04-05 ENCOUNTER — Encounter (HOSPITAL_COMMUNITY): Payer: Self-pay | Admitting: *Deleted

## 2022-04-05 ENCOUNTER — Other Ambulatory Visit: Payer: Self-pay

## 2022-04-05 ENCOUNTER — Emergency Department (HOSPITAL_COMMUNITY): Payer: No Typology Code available for payment source

## 2022-04-05 DIAGNOSIS — Y990 Civilian activity done for income or pay: Secondary | ICD-10-CM | POA: Diagnosis not present

## 2022-04-05 DIAGNOSIS — M25552 Pain in left hip: Secondary | ICD-10-CM | POA: Insufficient documentation

## 2022-04-05 DIAGNOSIS — M546 Pain in thoracic spine: Secondary | ICD-10-CM | POA: Insufficient documentation

## 2022-04-05 DIAGNOSIS — E119 Type 2 diabetes mellitus without complications: Secondary | ICD-10-CM | POA: Insufficient documentation

## 2022-04-05 DIAGNOSIS — W19XXXA Unspecified fall, initial encounter: Secondary | ICD-10-CM

## 2022-04-05 DIAGNOSIS — M545 Low back pain, unspecified: Secondary | ICD-10-CM | POA: Insufficient documentation

## 2022-04-05 DIAGNOSIS — W07XXXA Fall from chair, initial encounter: Secondary | ICD-10-CM | POA: Insufficient documentation

## 2022-04-05 MED ORDER — KETOROLAC TROMETHAMINE 15 MG/ML IJ SOLN
15.0000 mg | Freq: Once | INTRAMUSCULAR | Status: AC
  Administered 2022-04-05: 15 mg via INTRAMUSCULAR
  Filled 2022-04-05: qty 1

## 2022-04-05 MED ORDER — CYCLOBENZAPRINE HCL 10 MG PO TABS: 10.0000 mg | ORAL_TABLET | Freq: Two times a day (BID) | ORAL | 0 refills | Status: AC | PRN

## 2022-04-05 MED ORDER — OXYCODONE-ACETAMINOPHEN 5-325 MG PO TABS
1.0000 | ORAL_TABLET | Freq: Once | ORAL | Status: AC
  Administered 2022-04-05: 1 via ORAL
  Filled 2022-04-05: qty 1

## 2022-04-05 MED ORDER — CYCLOBENZAPRINE HCL 10 MG PO TABS
5.0000 mg | ORAL_TABLET | Freq: Once | ORAL | Status: AC
  Administered 2022-04-05: 5 mg via ORAL
  Filled 2022-04-05: qty 1

## 2022-04-05 MED ORDER — LIDOCAINE 5 % EX PTCH
1.0000 | MEDICATED_PATCH | CUTANEOUS | Status: DC
  Administered 2022-04-05: 1 via TRANSDERMAL
  Filled 2022-04-05: qty 1

## 2022-04-05 NOTE — ED Triage Notes (Signed)
BIB [employer], here from work s/p fall from chair, chair gave way, fell ~ 1 ft, fell onto buttocks/ L hip. C/o L hip pain. No obvious deformity, no shortening or rotation. CMS intact. Unable to stand/ bear weight. No meds PTA.

## 2022-04-05 NOTE — ED Notes (Signed)
Went to pt room around '@13'$ .30 at Dr request to ask pt to ambulate. Patient refused . Advised Dr of patient reply

## 2022-04-05 NOTE — Discharge Instructions (Addendum)
You came to the emergency department today to be evaluated for your hip and back pain after suffering a fall.  The x-rays obtained did not show any broken bones or dislocations.  Due to your continued pain with weightbearing I have given you crutches.  Please use them as needed.  Please follow-up with the orthopedic provider listed on this paperwork for repeat evaluation.  Today you received medications that may make you sleepy or impair your ability to make decisions.  For the next 24 hours please do not drive, operate heavy machinery, care for a small child with out another adult present, or perform any activities that may cause harm to you or someone else if you were to fall asleep or be impaired.   You are being prescribed a medication which may make you sleepy or mpair your ability to make decisions. Please follow up of listed precautions for at least 24 hours after taking one dose.  Please take Ibuprofen (Advil, motrin) and Tylenol (acetaminophen) to relieve your pain.    You may take up to 600 MG (3 pills) of normal strength ibuprofen every 8 hours as needed.   You make take tylenol, up to 1,000 mg (two extra strength pills) every 8 hours as needed.   It is safe to take ibuprofen and tylenol at the same time as they work differently.   Do not take more than 3,000 mg tylenol in a 24 hour period (not more than one dose every 8 hours.  Please check all medication labels as many medications such as pain and cold medications may contain tylenol.  Do not drink alcohol while taking these medications.  Do not take other NSAID'S while taking ibuprofen (such as aleve or naproxen).  Please take ibuprofen with food to decrease stomach upset.  Get help right away if: You develop new bowel or bladder control problems. You have unusual weakness or numbness in your arms or legs. You feel faint.

## 2022-04-05 NOTE — ED Notes (Signed)
ED PA at [facility] 

## 2022-04-05 NOTE — ED Provider Notes (Signed)
[facility] EMERGENCY DEPARTMENT Provider Note   CSN: [id number] Arrival date & time: 04/05/22  1011     History  Chief Complaint  Patient presents with   Rhonda Turner is a 31 y.o. female with medical history of hyperlipidemia, gestational diabetes, anemia.  Presents to the emergency department with a chief complaint of left hip and back pain after suffering a fall.  Patient reports that today approximately 41 she was at work when the chair she was sitting on broke.  Patient reports that she fell approximately 1 foot straight onto her buttocks.  Patient has had pain to left hip, lumbar, and thoracic back since then.  Patient has not been able to stand and ambulate due to her pain.  Patient rates pain 8/10 on the pain scale.  Pain is worse with any touch or movement.  Patient has not had any medications to alleviate her pain.  Patient endorses hitting her head in this fall however denies any loss of consciousness or vomiting afterwards.  Patient is not on any blood thinners.  Denies any numbness, weakness, saddle anesthesia, bowel/bladder incontinence, neck pain, abdominal pain, nausea, vomiting, lightheadedness, syncope, abdominal pain, chest pain, shortness of breath.   Fall Pertinent negatives include no chest pain, no abdominal pain, no headaches and no shortness of breath.      Home Medications Prior to Admission medications   Medication Sig Start Date End Date Taking? Authorizing Provider  acetaminophen (TYLENOL) 500 MG tablet Take 2 tablets (1,000 mg total) by mouth every 6 (six) hours. 07/31/20  Yes Paula Compton, MD      Allergies    Shellfish allergy    Review of Systems   Review of Systems  Constitutional:  Negative for chills and fever.  HENT:  Negative for facial swelling.   Eyes:  Negative for visual disturbance.  Respiratory:  Negative for shortness of breath.   Cardiovascular:  Negative for chest pain.  Gastrointestinal:  Negative for  abdominal pain, nausea and vomiting.  Musculoskeletal:  Positive for arthralgias and back pain. Negative for neck pain.  Skin:  Negative for color change, pallor, rash and wound.  Neurological:  Negative for dizziness, syncope, weakness, light-headedness, numbness and headaches.  Psychiatric/Behavioral:  Negative for confusion.    Physical Exam Updated Vital Signs BP 128/80 (BP Location: Left Arm)   Pulse 60   Temp 98.3 F (36.8 C) (Oral)   Resp 16   Ht '5\' 5"'$  (1.651 m)   Wt 126.1 kg   LMP 03/30/2022 (Exact Date)   SpO2 99%   BMI 46.26 kg/m   Physical Exam Vitals and nursing note reviewed.  Constitutional:      General: She is not in acute distress.    Appearance: She is not ill-appearing, toxic-appearing or diaphoretic.  HENT:     Head: Normocephalic and atraumatic. No raccoon eyes, Battle's sign, abrasion, contusion, right periorbital erythema, left periorbital erythema or laceration.     Jaw: No trismus, tenderness, swelling, pain on movement or malocclusion.  Eyes:     General: No scleral icterus.       Right eye: No discharge.        Left eye: No discharge.  Cardiovascular:     Rate and Rhythm: Normal rate.     Pulses:          Dorsalis pedis pulses are 2+ on the right side and 2+ on the left side.  Pulmonary:     Effort: Pulmonary effort is normal.  Musculoskeletal:     Cervical back: No swelling, edema, deformity, erythema, signs of trauma, lacerations, rigidity, spasms, torticollis, tenderness, bony tenderness or crepitus. No pain with movement. Normal range of motion.     Thoracic back: Tenderness present. No swelling, edema, deformity, signs of trauma, lacerations, spasms or bony tenderness.     Lumbar back: Tenderness present. No swelling, edema, deformity, signs of trauma, lacerations, spasms or bony tenderness.     Right hip: No deformity, lacerations, tenderness, bony tenderness or crepitus.     Left hip: Tenderness and bony tenderness present. No deformity,  lacerations or crepitus. Decreased range of motion.     Right upper leg: Normal.     Left upper leg: Normal.     Right knee: No swelling, deformity, effusion, erythema, ecchymosis, lacerations, bony tenderness or crepitus. Normal range of motion. No tenderness. Normal alignment.     Left knee: No swelling, deformity, effusion, erythema, ecchymosis, lacerations, bony tenderness or crepitus. Normal range of motion. No tenderness. Normal alignment.     Right lower leg: No swelling, deformity, lacerations, tenderness or bony tenderness. No edema.     Left lower leg: No swelling, deformity, lacerations, tenderness or bony tenderness. No edema.     Right ankle: No swelling, deformity, ecchymosis or lacerations. No tenderness. Normal range of motion.     Left ankle: No swelling, deformity, ecchymosis or lacerations. No tenderness. Normal range of motion.     Right foot: Normal range of motion and normal capillary refill. No swelling, deformity, laceration, tenderness, bony tenderness or crepitus. Normal pulse.     Left foot: Normal range of motion and normal capillary refill. No swelling, deformity, laceration, tenderness, bony tenderness or crepitus. Normal pulse.     Comments: No midline tenderness or deformity to cervical, thoracic, or lumbar spine.  Patient has diffuse tenderness to bilateral lumbar and thoracic back.  Tenderness to left hip.  No obvious deformity or instability of pelvis.  Unable to determine leg length as patient will not straighten leg due to reports of pain.  Patient does have full range of motion to left knee.    Skin:    General: Skin is warm and dry.  Neurological:     General: No focal deficit present.     Mental Status: She is alert and oriented to person, place, and time.     GCS: GCS eye subscore is 4. GCS verbal subscore is 5. GCS motor subscore is 6.     Cranial Nerves: Cranial nerves 2-12 are intact. No cranial nerve deficit, dysarthria or facial asymmetry.      Sensory: Sensation is intact.     Comments: Patient to light touch grossly intact to bilateral upper and lower extremities.  +2 bilateral upper extremities.  Psychiatric:        Behavior: Behavior is cooperative.    ED Results / Procedures / Treatments   Labs (all labs ordered are listed, but only abnormal results are displayed) Labs Reviewed - No data to display  EKG None  Radiology DG Thoracic Spine 2 View  Result Date: 04/05/2022 CLINICAL DATA:  Back pain after falling through chair at work today. EXAM: THORACIC SPINE 2 VIEWS COMPARISON:  None Available. FINDINGS: There is no evidence of thoracic spine fracture. Alignment is normal. No other significant bone abnormalities are identified. IMPRESSION: No acute osseous abnormality. Electronically Signed   By: Dahlia Bailiff M.D.   On: 04/05/2022 11:54   DG Lumbar Spine Complete  Result Date: 04/05/2022 CLINICAL DATA:  Back pain after falling through chair at work today. EXAM: LUMBAR SPINE - COMPLETE 4+ VIEW COMPARISON:  None Available. FINDINGS: There is no evidence of lumbar spine fracture. Alignment is normal. Intervertebral disc spaces are maintained. IMPRESSION: No acute osseous abnormality. Electronically Signed   By: Dahlia Bailiff M.D.   On: 04/05/2022 11:53   DG Hip Unilat W or Wo Pelvis 2-3 Views Left  Result Date: 04/05/2022 CLINICAL DATA:  Left hip pain after falling through chair work today. EXAM: DG HIP (WITH OR WITHOUT PELVIS) 2-3V LEFT COMPARISON:  None Available. FINDINGS: There is no evidence of hip fracture or dislocation. There is no evidence of arthropathy or other focal bone abnormality. IMPRESSION: No acute osseous abnormality. Electronically Signed   By: Dahlia Bailiff M.D.   On: 04/05/2022 11:52    Procedures Procedures    Medications Ordered in ED Medications  lidocaine (LIDODERM) 5 % 1 patch (1 patch Transdermal Patch Applied 04/05/22 1210)  oxyCODONE-acetaminophen (PERCOCET/ROXICET) 5-325 MG per tablet 1  tablet (1 tablet Oral Given 04/05/22 1209)  ketorolac (TORADOL) 15 MG/ML injection 15 mg (15 mg Intramuscular Given 04/05/22 1432)  cyclobenzaprine (FLEXERIL) tablet 5 mg (5 mg Oral Given 04/05/22 1531)    ED Course/ Medical Decision Making/ A&P                           Medical Decision Making Amount and/or Complexity of Data Reviewed Radiology: ordered.  Risk Prescription drug management.   Alert 31 year old female no acute distress, nontoxic-appearing.  Presents to the ED with a chief complaint of left hip and back pain after suffering a fall.  Information obtained from patient.  Past medical records were reviewed including previous provider notes and labs.  Patient has medical history as outlined in HPI which complicates her care.  Due to reports of left hip pain with inability to weight-bear will obtain x-ray imaging to evaluate for acute osseous abnormality.  Patient has no midline tenderness or deformity to cervical, thoracic, or lumbar spine however does have tenderness to bilateral lumbar and thoracic back.  Will obtain x-ray imaging to look for acute osseous abnormality.  Patient given Percocet for pain management.  I personally viewed and interpreted patient's x-ray imaging.  Imaging shows no acute osseous abnormality to thoracic, lumbar, or left hip.  Patient reports improvement in pain after receiving medication.  We will plan to ambulate patient and discharged home.  Patient has worsening pain with any attempt to sit up.  Reports that pain is to her left hip.  We will give patient dose of Toradol and reassess.  Patient able to sit up without difficulty at this time after however continues to complain of worsening left hip pain with movement.  Will give patient muscle relaxer and reassess.  Patient has improvement in pain after receiving Flexeril.  Is able to move to a standing position however still has pain with weightbearing.    Will give patient crutches and make  nonweightbearing until she can follow-up with orthopedic provider in outpatient setting.  We will send patient home with prescription for Flexeril.  Discussed symptomatic treatment with over-the-counter pain medication.  Based on patient's chief complaint, I considered admission might be necessary, however after reassuring ED workup feel patient is reasonable for discharge.  Discussed results, findings, treatment and follow up. Patient advised of return precautions. Patient verbalized understanding and agreed with plan.  Portions of this note were generated with [employer]. Dictation errors  may occur despite best attempts at proofreading.         Final Clinical Impression(s) / ED Diagnoses Final diagnoses:  Fall, initial encounter  Acute bilateral low back pain without sciatica  Acute bilateral thoracic back pain  Left hip pain    Rx / DC Orders ED Discharge Orders          Ordered    cyclobenzaprine (FLEXERIL) 10 MG tablet  2 times daily PRN        04/05/22 1614              Dyann Ruddle 04/05/22 1932    Davonna Belling, MD 04/06/22 0700

## 2023-12-31 IMAGING — DX DG LUMBAR SPINE COMPLETE 4+V
5 series · 5 of 5 positions shown · non-contrast
Comparison: None Available.

CLINICAL DATA: Back pain after falling through chair at work today.

EXAM:
LUMBAR SPINE - COMPLETE 4+ VIEW

[l-spine ap]
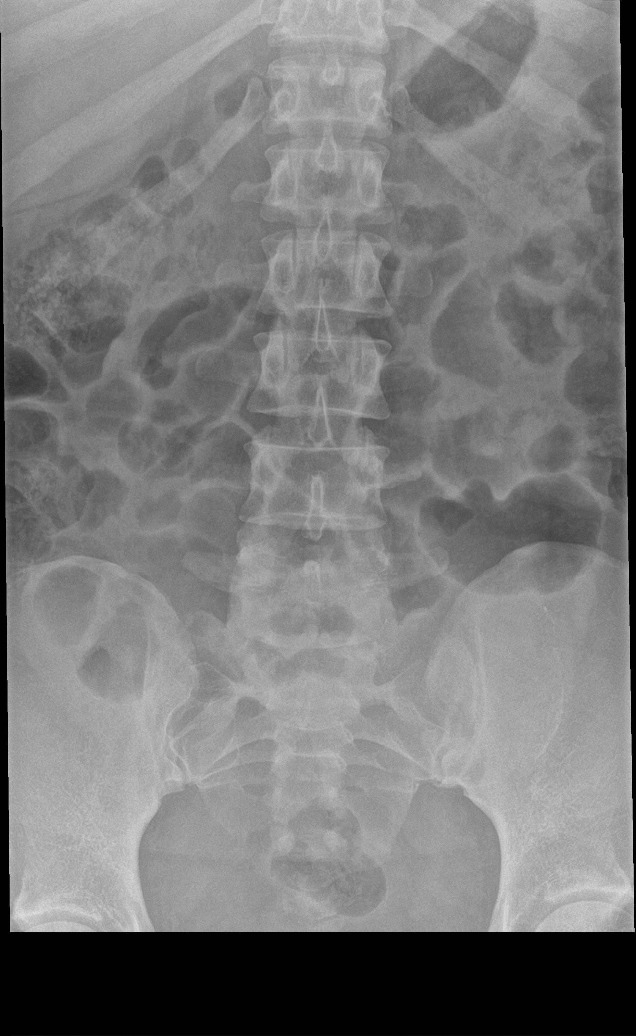

[l-spine obl (1 of 2)]
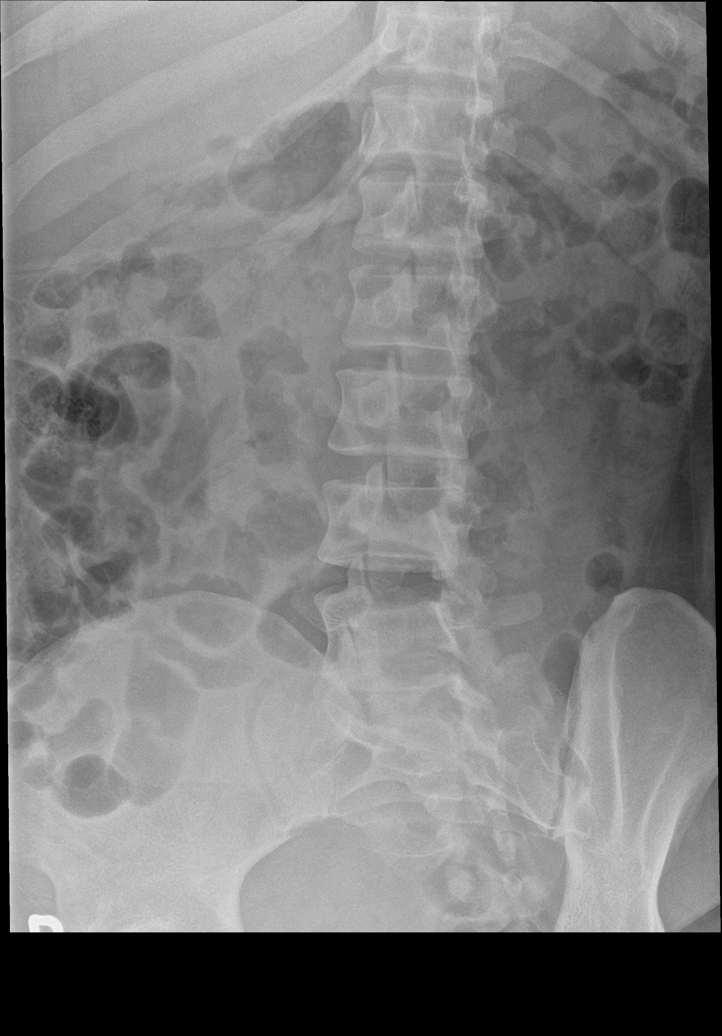

[l-spine obl (2 of 2)]
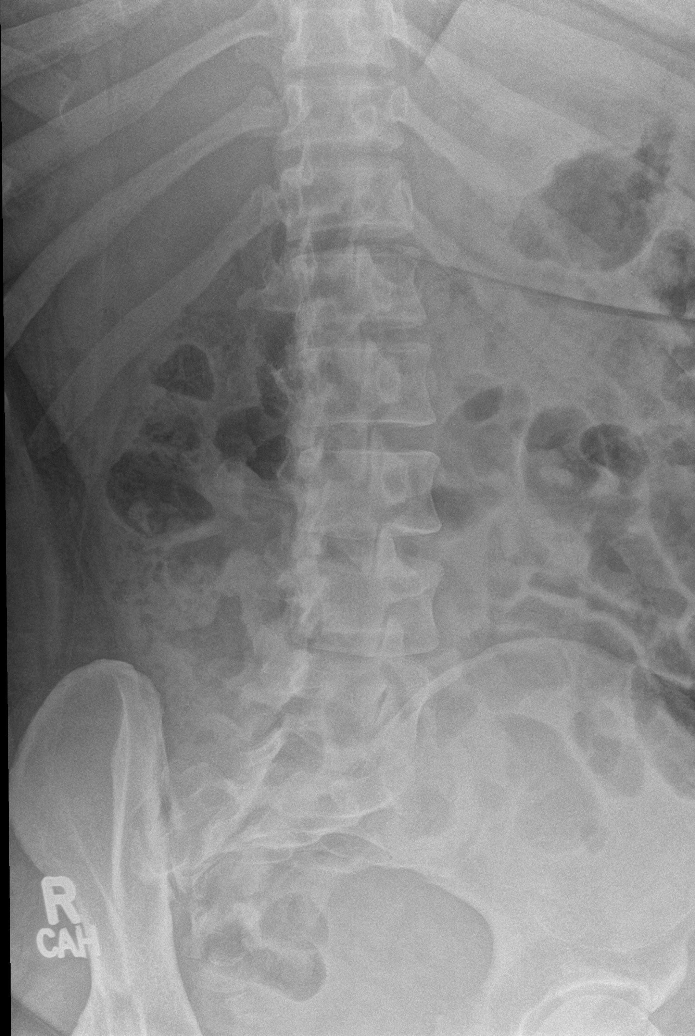

[l-spine lat]
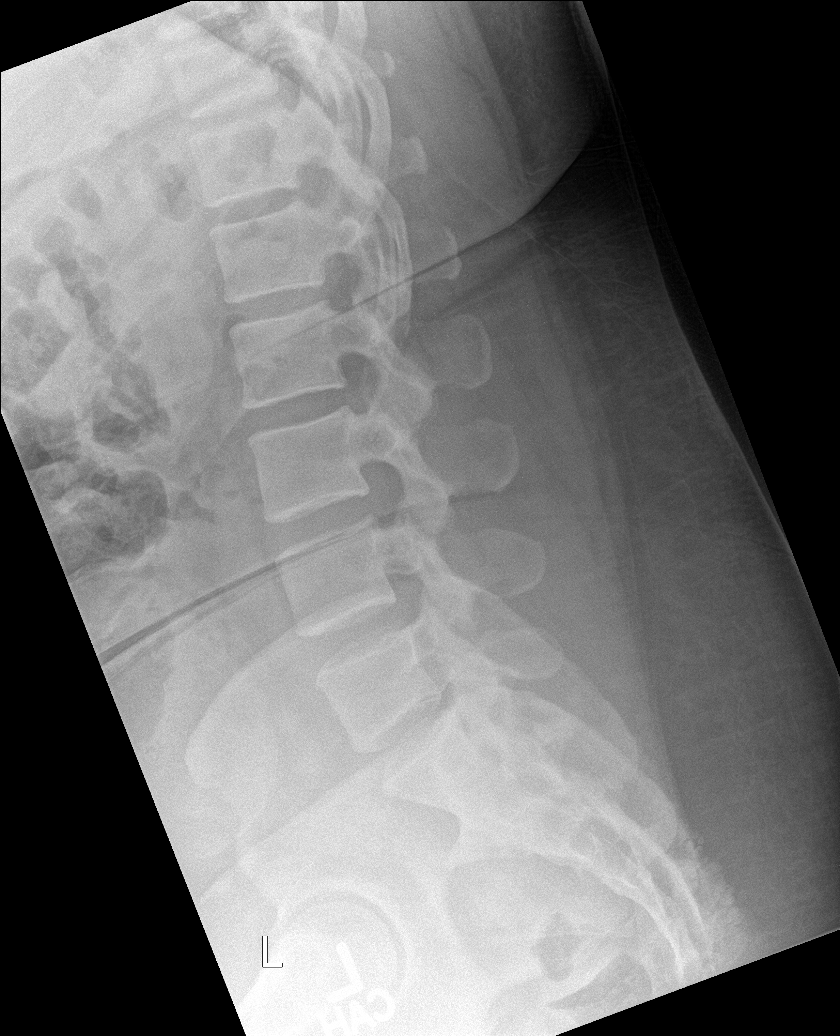

[l-spine spot]
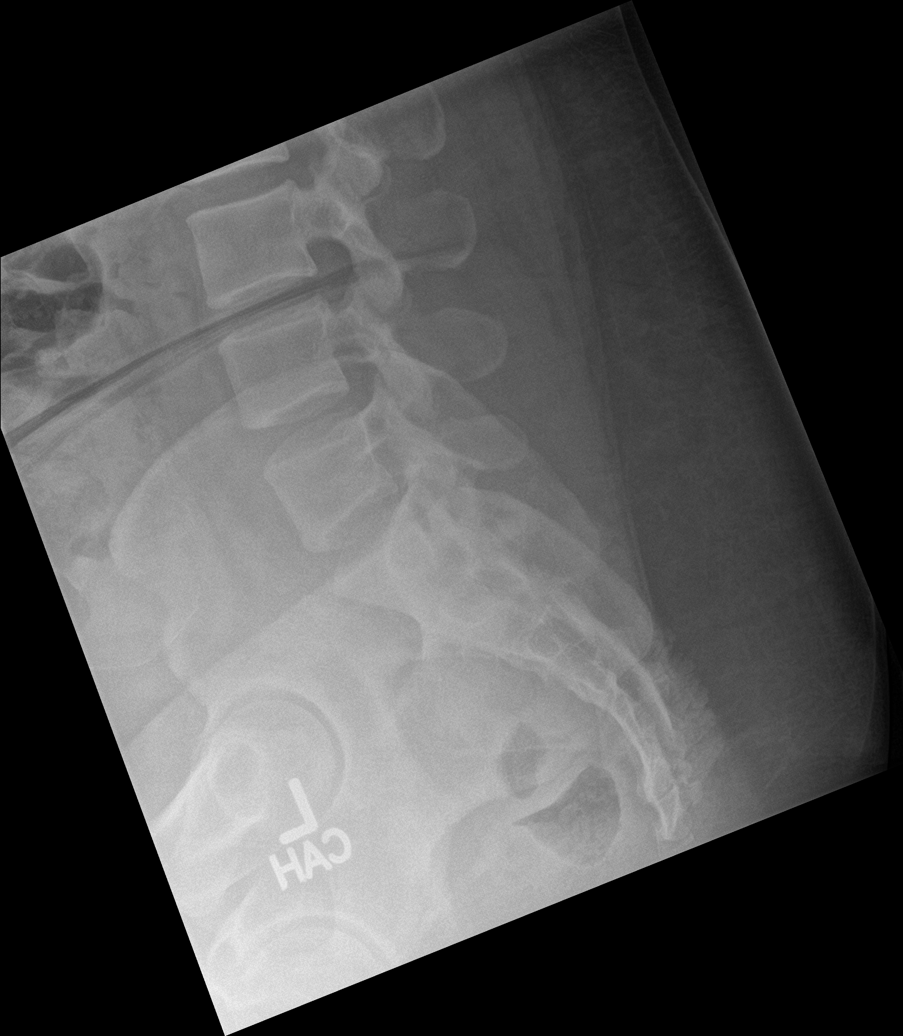

[5 of 5 positions shown; findings below may reference images not displayed]

FINDINGS: There is no evidence of lumbar spine fracture. Alignment is normal.
Intervertebral disc spaces are maintained.
IMPRESSION: No acute osseous abnormality.

## 2024-09-07 DIAGNOSIS — E7800 Pure hypercholesterolemia, unspecified: Secondary | ICD-10-CM | POA: Diagnosis not present

## 2024-09-07 DIAGNOSIS — Z862 Personal history of diseases of the blood and blood-forming organs and certain disorders involving the immune mechanism: Secondary | ICD-10-CM | POA: Diagnosis not present

## 2024-09-07 DIAGNOSIS — R7303 Prediabetes: Secondary | ICD-10-CM | POA: Diagnosis not present

## 2024-09-14 DIAGNOSIS — L0292 Furuncle, unspecified: Secondary | ICD-10-CM | POA: Diagnosis not present

## 2024-09-14 DIAGNOSIS — Z0100 Encounter for examination of eyes and vision without abnormal findings: Secondary | ICD-10-CM | POA: Diagnosis not present

## 2024-10-18 DIAGNOSIS — Z130 Encounter for screening for diseases of the blood and blood-forming organs and certain disorders involving the immune mechanism: Secondary | ICD-10-CM | POA: Diagnosis not present

## 2024-10-18 DIAGNOSIS — Z01419 Encounter for gynecological examination (general) (routine) without abnormal findings: Secondary | ICD-10-CM | POA: Diagnosis not present

## 2024-10-19 DIAGNOSIS — Z124 Encounter for screening for malignant neoplasm of cervix: Secondary | ICD-10-CM | POA: Diagnosis not present
